# Patient Record
Sex: Female | Born: 1985 | Race: White | Hispanic: No | Smoking: Former smoker
Health system: Southern US, Community
[De-identification: ages and names within clinical notes are randomized; demographics above are authoritative.]

## PROBLEM LIST (undated history)

## (undated) DIAGNOSIS — Z8659 Personal history of other mental and behavioral disorders: Secondary | ICD-10-CM

## (undated) DIAGNOSIS — Z87898 Personal history of other specified conditions: Secondary | ICD-10-CM

## (undated) DIAGNOSIS — F32A Depression, unspecified: Secondary | ICD-10-CM

## (undated) DIAGNOSIS — K219 Gastro-esophageal reflux disease without esophagitis: Secondary | ICD-10-CM

## (undated) DIAGNOSIS — I10 Essential (primary) hypertension: Secondary | ICD-10-CM

## (undated) DIAGNOSIS — Z8719 Personal history of other diseases of the digestive system: Secondary | ICD-10-CM

## (undated) DIAGNOSIS — Z8669 Personal history of other diseases of the nervous system and sense organs: Secondary | ICD-10-CM

## (undated) DIAGNOSIS — F419 Anxiety disorder, unspecified: Secondary | ICD-10-CM

## (undated) DIAGNOSIS — D649 Anemia, unspecified: Secondary | ICD-10-CM

## (undated) DIAGNOSIS — Z860100 Personal history of colon polyps, unspecified: Secondary | ICD-10-CM

## (undated) DIAGNOSIS — F329 Major depressive disorder, single episode, unspecified: Secondary | ICD-10-CM

## (undated) HISTORY — DX: Personal history of colonic polyps: Z86.010

## (undated) HISTORY — DX: Personal history of other diseases of the nervous system and sense organs: Z86.69

## (undated) HISTORY — DX: Personal history of colon polyps, unspecified: Z86.0100

## (undated) HISTORY — DX: Personal history of other mental and behavioral disorders: Z86.59

## (undated) HISTORY — DX: Essential (primary) hypertension: I10

## (undated) HISTORY — PX: NO PAST SURGERIES: SHX2092

## (undated) HISTORY — DX: Personal history of other diseases of the digestive system: Z87.19

## (undated) HISTORY — DX: Personal history of other specified conditions: Z87.898

---

## 2003-10-15 ENCOUNTER — Inpatient Hospital Stay: Payer: Self-pay | Admitting: Psychiatry

## 2003-10-15 ENCOUNTER — Other Ambulatory Visit: Payer: Self-pay

## 2003-10-22 ENCOUNTER — Ambulatory Visit: Payer: Self-pay | Admitting: Unknown Physician Specialty

## 2003-10-28 ENCOUNTER — Ambulatory Visit: Payer: Self-pay | Admitting: Psychiatry

## 2003-11-03 ENCOUNTER — Ambulatory Visit: Payer: Self-pay | Admitting: Unknown Physician Specialty

## 2003-12-03 ENCOUNTER — Ambulatory Visit: Payer: Self-pay | Admitting: Unknown Physician Specialty

## 2004-01-03 ENCOUNTER — Ambulatory Visit: Payer: Self-pay | Admitting: Unknown Physician Specialty

## 2004-11-01 ENCOUNTER — Emergency Department (HOSPITAL_COMMUNITY): Admission: EM | Admit: 2004-11-01 | Discharge: 2004-11-01 | Payer: Self-pay | Admitting: Emergency Medicine

## 2005-05-06 ENCOUNTER — Emergency Department: Payer: Self-pay | Admitting: Internal Medicine

## 2005-05-07 ENCOUNTER — Emergency Department: Payer: Self-pay | Admitting: Emergency Medicine

## 2006-08-01 ENCOUNTER — Inpatient Hospital Stay: Payer: Self-pay

## 2008-10-03 ENCOUNTER — Emergency Department: Payer: Self-pay | Admitting: Emergency Medicine

## 2009-10-22 ENCOUNTER — Emergency Department: Payer: Self-pay | Admitting: Emergency Medicine

## 2010-12-31 ENCOUNTER — Inpatient Hospital Stay: Payer: Self-pay | Admitting: Obstetrics and Gynecology

## 2013-04-24 ENCOUNTER — Ambulatory Visit (INDEPENDENT_AMBULATORY_CARE_PROVIDER_SITE_OTHER): Payer: Self-pay | Admitting: Family Medicine

## 2013-04-24 ENCOUNTER — Encounter: Payer: Self-pay | Admitting: Family Medicine

## 2013-04-24 VITALS — BP 140/78 | HR 126 | Temp 98.6°F | Ht 61.25 in | Wt 128.2 lb

## 2013-04-24 DIAGNOSIS — R000 Tachycardia, unspecified: Secondary | ICD-10-CM

## 2013-04-24 DIAGNOSIS — F17200 Nicotine dependence, unspecified, uncomplicated: Secondary | ICD-10-CM

## 2013-04-24 DIAGNOSIS — Z720 Tobacco use: Secondary | ICD-10-CM

## 2013-04-24 DIAGNOSIS — R41840 Attention and concentration deficit: Secondary | ICD-10-CM

## 2013-04-24 DIAGNOSIS — F4321 Adjustment disorder with depressed mood: Secondary | ICD-10-CM

## 2013-04-24 DIAGNOSIS — Z634 Disappearance and death of family member: Secondary | ICD-10-CM

## 2013-04-24 DIAGNOSIS — F4329 Adjustment disorder with other symptoms: Secondary | ICD-10-CM | POA: Insufficient documentation

## 2013-04-24 DIAGNOSIS — Z136 Encounter for screening for cardiovascular disorders: Secondary | ICD-10-CM

## 2013-04-24 DIAGNOSIS — IMO0002 Reserved for concepts with insufficient information to code with codable children: Secondary | ICD-10-CM

## 2013-04-24 DIAGNOSIS — T7411XA Adult physical abuse, confirmed, initial encounter: Secondary | ICD-10-CM
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
## 2013-04-24 NOTE — Assessment & Plan Note (Signed)
Refer to Evalina FieldJane Perrin or Terri B for ADD eval. The patient indicates understanding of these issues and agrees with the plan.
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
## 2013-04-24 NOTE — Assessment & Plan Note (Signed)
Persisted throughout exam.  Pt reports being nervous but I cannot rule out an arrythmia or substance abuse. She is refusing EKG due to cost although I did encourage her to have this done today.  She wants to talk to her sister first.  Luckily, her husband works for [employer] so she did allow me to order some labs today. Orders Placed This Encounter  Procedures  . CBC with Differential  . Comprehensive metabolic panel  . Lipid panel  . TSH  . T4, Free  . Ambulatory referral to Psychology

## 2013-04-24 NOTE — Patient Instructions (Addendum)
It was nice to meet you. [facility] has a patient assistance program- please ask about this at the front desk.  Please go to front desk and let them know you need to set up a referral or they will call if you if this is not an urgent referral.  Either MARION or LINDA will help you set it up.   I will call you with your lab results.  I would like for you to have an EKG.  [facility] about [facility].

## 2013-04-24 NOTE — Progress Notes (Signed)
Pre visit review using our clinic review tool, if applicable. No additional management support is needed unless otherwise documented below in the visit note. 

## 2013-04-24 NOTE — Assessment & Plan Note (Signed)
See above.   Continue psychotherapy at hospice.

## 2013-04-25 ENCOUNTER — Encounter: Payer: Self-pay | Admitting: *Deleted

## 2013-04-25 ENCOUNTER — Ambulatory Visit (INDEPENDENT_AMBULATORY_CARE_PROVIDER_SITE_OTHER): Payer: Self-pay | Admitting: Psychology

## 2013-04-25 ENCOUNTER — Telehealth: Payer: Self-pay | Admitting: Family Medicine

## 2013-04-25 DIAGNOSIS — F988 Other specified behavioral and emotional disorders with onset usually occurring in childhood and adolescence: Secondary | ICD-10-CM

## 2013-04-25 LAB — CBC WITH DIFFERENTIAL/PLATELET

## 2013-04-25 LAB — COMPREHENSIVE METABOLIC PANEL
ALK PHOS: 59 IU/L (ref 39–117)
ALT: 8 IU/L (ref 0–32)
AST: 11 IU/L (ref 0–40)
Albumin/Globulin Ratio: 1.9 (ref 1.1–2.5)
Albumin: 4.5 g/dL (ref 3.5–5.5)
BUN/Creatinine Ratio: 20 (ref 8–20)
BUN: 9 mg/dL (ref 6–20)
CHLORIDE: 104 mmol/L (ref 97–108)
CO2: 21 mmol/L (ref 18–29)
Calcium: 9.3 mg/dL (ref 8.7–10.2)
Creatinine, Ser: 0.44 mg/dL — ABNORMAL LOW (ref 0.57–1.00)
GFR calc Af Amer: 158 mL/min/{1.73_m2} (ref >59)
GFR calc non Af Amer: 137 mL/min/{1.73_m2} (ref >59)
Globulin, Total: 2.4 g/dL (ref 1.5–4.5)
Glucose: 75 mg/dL (ref 65–99)
POTASSIUM: 4.2 mmol/L (ref 3.5–5.2)
SODIUM: 142 mmol/L (ref 134–144)
Total Bilirubin: <0.2 mg/dL (ref 0.0–1.2)
Total Protein: 6.9 g/dL (ref 6.0–8.5)

## 2013-04-25 LAB — LIPID PANEL
CHOLESTEROL TOTAL: 138 mg/dL (ref 100–199)
Chol/HDL Ratio: 3.1 ratio units (ref 0.0–4.4)
HDL: 44 mg/dL (ref >39)
LDL Calculated: 46 mg/dL (ref 0–99)
Triglycerides: 240 mg/dL — ABNORMAL HIGH (ref 0–149)
VLDL Cholesterol Cal: 48 mg/dL — ABNORMAL HIGH (ref 5–40)

## 2013-04-25 LAB — TSH: TSH: 1.66 u[IU]/mL (ref 0.450–4.500)

## 2013-04-25 LAB — T4, FREE: FREE T4: 1.48 ng/dL (ref 0.82–1.77)

## 2013-04-25 NOTE — Telephone Encounter (Signed)
Relevant patient education assigned to patient using Emmi. ° °

## 2013-04-28 ENCOUNTER — Other Ambulatory Visit: Payer: Self-pay | Admitting: Family Medicine

## 2013-04-28 DIAGNOSIS — R000 Tachycardia, unspecified: Secondary | ICD-10-CM

## 2013-04-29 ENCOUNTER — Telehealth: Payer: Self-pay

## 2013-04-29 NOTE — Telephone Encounter (Signed)
Spoke to pt and advised per Dr Aron. Pt verbally expressed understanding.  

## 2013-04-29 NOTE — Telephone Encounter (Signed)
I have not yet received the results from her tests but as soon as I do, we can talk about medications.

## 2013-04-29 NOTE — Telephone Encounter (Signed)
Pt left v/m; pt was seen on 04/24/13 and was sent for testing for ADD. Pt had testing done on 04/25/13. Pt wants to know if Dr Dayton MartesAron got the testing results for ADD; pt is wondering about med for depression since had ADD testing. Pt [facility] cb. [facility] [place].

## 2013-05-07 ENCOUNTER — Ambulatory Visit (INDEPENDENT_AMBULATORY_CARE_PROVIDER_SITE_OTHER): Payer: Medicaid Other | Admitting: Psychology

## 2013-05-07 DIAGNOSIS — F4323 Adjustment disorder with mixed anxiety and depressed mood: Secondary | ICD-10-CM

## 2013-05-07 DIAGNOSIS — F988 Other specified behavioral and emotional disorders with onset usually occurring in childhood and adolescence: Secondary | ICD-10-CM

## 2013-05-13 ENCOUNTER — Ambulatory Visit (INDEPENDENT_AMBULATORY_CARE_PROVIDER_SITE_OTHER): Payer: Medicaid Other | Admitting: Family Medicine

## 2013-05-13 ENCOUNTER — Encounter: Payer: Self-pay | Admitting: Family Medicine

## 2013-05-13 ENCOUNTER — Telehealth: Payer: Self-pay

## 2013-05-13 VITALS — BP 116/78 | HR 112 | Temp 98.3°F | Ht 61.25 in | Wt 126.8 lb

## 2013-05-13 DIAGNOSIS — F909 Attention-deficit hyperactivity disorder, unspecified type: Secondary | ICD-10-CM

## 2013-05-13 MED ORDER — AMPHETAMINE-DEXTROAMPHET ER 15 MG PO CP24: 15.0000 mg | ORAL_CAPSULE | ORAL | Status: DC

## 2013-05-13 NOTE — Telephone Encounter (Signed)
Yes ok to change to regular adderall.  Please call pharmacy to advise.

## 2013-05-13 NOTE — Telephone Encounter (Signed)
Pt left v/m; pt was seen earlier today and pt went to pharmacy but adderall XR cost was $200.00; pt does not have ins and could not afford $200.00. Pharmacy advised pt more cost effective to get Adderall plain without XR. Pt request cb.

## 2013-05-13 NOTE — Patient Instructions (Signed)
Great to see you. We are starting Adderall 15 mg XL daily. Please come see me or call me in 1 month.

## 2013-05-13 NOTE — Progress Notes (Signed)
Pre visit review using our clinic review tool, if applicable. No additional management support is needed unless otherwise documented below in the visit note. 
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
## 2013-05-13 NOTE — Assessment & Plan Note (Signed)
>  15 minutes spent in face to face time with patient, >50% spent in counselling or coordination of care. Discussed treatment options. Rx given for Adderall 15 mg XL daily. Follow up in 1 month. The patient indicates understanding of these issues and agrees with the plan.

## 2013-05-14 MED ORDER — AMPHETAMINE-DEXTROAMPHETAMINE 15 MG PO TABS: 15.0000 mg | ORAL_TABLET | Freq: Every day | ORAL | Status: DC

## 2013-05-14 NOTE — Telephone Encounter (Signed)
Spoke to pt and informed her Rx is available for pick up at the front desk 

## 2013-05-14 NOTE — Telephone Encounter (Signed)
Rx printed

## 2013-05-14 NOTE — Telephone Encounter (Signed)
Spoke to pharmacist who states that the pt will need a new Rx hard script as this is a "change in medication."

## 2013-05-22 ENCOUNTER — Emergency Department: Payer: Self-pay | Admitting: Emergency Medicine

## 2013-06-12 ENCOUNTER — Ambulatory Visit: Payer: Self-pay | Admitting: Family Medicine

## 2013-06-12 ENCOUNTER — Ambulatory Visit (INDEPENDENT_AMBULATORY_CARE_PROVIDER_SITE_OTHER): Payer: Medicaid Other | Admitting: Family Medicine

## 2013-06-12 ENCOUNTER — Encounter: Payer: Self-pay | Admitting: Family Medicine

## 2013-06-12 ENCOUNTER — Encounter: Payer: Self-pay | Admitting: *Deleted

## 2013-06-12 VITALS — BP 122/70 | HR 108 | Temp 97.9°F | Wt 125.5 lb

## 2013-06-12 DIAGNOSIS — Z634 Disappearance and death of family member: Secondary | ICD-10-CM

## 2013-06-12 DIAGNOSIS — J019 Acute sinusitis, unspecified: Secondary | ICD-10-CM

## 2013-06-12 DIAGNOSIS — IMO0002 Reserved for concepts with insufficient information to code with codable children: Secondary | ICD-10-CM

## 2013-06-12 DIAGNOSIS — F4329 Adjustment disorder with other symptoms: Secondary | ICD-10-CM

## 2013-06-12 DIAGNOSIS — F909 Attention-deficit hyperactivity disorder, unspecified type: Secondary | ICD-10-CM

## 2013-06-12 DIAGNOSIS — T7492XA Unspecified child maltreatment, confirmed, initial encounter: Secondary | ICD-10-CM

## 2013-06-12 DIAGNOSIS — F4381 Prolonged grief disorder: Secondary | ICD-10-CM

## 2013-06-12 DIAGNOSIS — T7411XA Adult physical abuse, confirmed, initial encounter: Secondary | ICD-10-CM

## 2013-06-12 DIAGNOSIS — Z0000 Encounter for general adult medical examination without abnormal findings: Secondary | ICD-10-CM | POA: Insufficient documentation

## 2013-06-12 DIAGNOSIS — Z309 Encounter for contraceptive management, unspecified: Secondary | ICD-10-CM

## 2013-06-12 DIAGNOSIS — F4321 Adjustment disorder with depressed mood: Secondary | ICD-10-CM

## 2013-06-12 MED ORDER — AMPHETAMINE-DEXTROAMPHETAMINE 15 MG PO TABS: 15.0000 mg | ORAL_TABLET | Freq: Every day | ORAL | Status: DC

## 2013-06-12 MED ORDER — MEDROXYPROGESTERONE ACETATE 150 MG/ML IM SUSP
150.0000 mg | Freq: Once | INTRAMUSCULAR | Status: AC
  Administered 2013-06-12: 150 mg via INTRAMUSCULAR

## 2013-06-12 NOTE — Assessment & Plan Note (Signed)
Discussed options. She would like to start depo today.

## 2013-06-12 NOTE — Patient Instructions (Signed)
Great to see you. Hang in there.  Please come back in 3 months for your depo injection.
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
## 2013-06-12 NOTE — Assessment & Plan Note (Signed)
Stable on current dose. Rx refilled.

## 2013-06-12 NOTE — Addendum Note (Signed)
Addended by: Desmond Dike on: 06/12/2013 12:48 PM   Modules accepted: Orders
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
## 2013-06-12 NOTE — Assessment & Plan Note (Signed)
S/p 10 day course of abx (awaiting records). Exam reassuring today.

## 2013-06-12 NOTE — Progress Notes (Signed)
Pre visit review using our clinic review tool, if applicable. No additional management support is needed unless otherwise documented below in the visit note. 

## 2013-06-23 ENCOUNTER — Telehealth: Payer: Self-pay

## 2013-06-23 NOTE — Telephone Encounter (Signed)
Pt left v/m; pt said adderall pt is presently taking makes pt feel like she is "crashing" after about 5 hours from taking med. Pt does not like the way medication makes her feel in the afternoon and pt would like to take a different med. Pt request cb.

## 2013-06-24 MED ORDER — AMPHETAMINE-DEXTROAMPHET ER 15 MG PO CP24: 15.0000 mg | ORAL_CAPSULE | ORAL | Status: DC

## 2013-06-24 NOTE — Telephone Encounter (Signed)
Yes that is because she is not on the extended release because she requested short acting due to cost.  We could try short acting twice daily if she would like.  If we switched rx, she would like have the same issues but we can try this as well if she is interested.

## 2013-06-24 NOTE — Telephone Encounter (Signed)
Lm on pts vm requesting a call back 

## 2013-06-24 NOTE — Telephone Encounter (Signed)
Rx printed

## 2013-06-24 NOTE — Telephone Encounter (Signed)
Spoke to pt who states that she has recently changed insurance companies and would like to try the extended release to see if it will be covered. If not, pt would like to precede with the short acting twice daily

## 2013-06-24 NOTE — Telephone Encounter (Signed)
Spoke to pt and informed her Rx is available for pickup from the front desk 

## 2013-07-07 ENCOUNTER — Telehealth: Payer: Self-pay

## 2013-07-07 ENCOUNTER — Encounter: Payer: Self-pay | Admitting: Family Medicine

## 2013-07-07 NOTE — Telephone Encounter (Signed)
Pt said since taking Adderall XR pt cannot go to sleep; usually 2AM before pt can go to sleep and pt has to get up at 6 AM.Pt request to go back on plain Adderall but to take twice a day as previously discussed. Call pt when rx ready for pick up.

## 2013-07-07 NOTE — Telephone Encounter (Signed)
Lm on pts vm advising per Dr Aron. Pt instructed to contact office should she have any additional questions 

## 2013-07-07 NOTE — Telephone Encounter (Signed)
Does she have any of her other rx (short acting) left because she is not yet due for another rx.

## 2013-07-07 NOTE — Telephone Encounter (Signed)
Ok to restart previous rx.

## 2013-07-07 NOTE — Telephone Encounter (Signed)
Spoke to pt who states that she does still have some of the short acting meds remaining.

## 2013-07-08 ENCOUNTER — Other Ambulatory Visit: Payer: Self-pay | Admitting: *Deleted

## 2013-07-08 ENCOUNTER — Telehealth: Payer: Self-pay | Admitting: Family Medicine

## 2013-07-08 MED ORDER — AMPHETAMINE-DEXTROAMPHETAMINE 15 MG PO TABS: 15.0000 mg | ORAL_TABLET | Freq: Two times a day (BID) | ORAL | Status: DC

## 2013-07-08 NOTE — Telephone Encounter (Signed)
Spoke to pt who is requesting medication refill. She recently switched back to short acting adderall BID and states that she does not have to last until 06/23 when the XR Rx was written. Last f/u appt 06/2013. pls advise

## 2013-07-08 NOTE — Telephone Encounter (Signed)
Ok to print out rx and put in my box.

## 2013-07-08 NOTE — Telephone Encounter (Signed)
Spoke to pt who states that she is needing medication refill. Request sent to Dr Dayton MartesAron for approval

## 2013-07-08 NOTE — Telephone Encounter (Signed)
Pt has some questions in reference to your conversation yesterday about medication. She would like for you to call her back. Thank you.

## 2013-07-09 NOTE — Telephone Encounter (Signed)
Lyla SonCarrie spoke to pt and informed her Rx is available for pickup at the front desk

## 2013-07-17 ENCOUNTER — Ambulatory Visit (INDEPENDENT_AMBULATORY_CARE_PROVIDER_SITE_OTHER)
Admission: RE | Admit: 2013-07-17 | Discharge: 2013-07-17 | Disposition: A | Discharge status: Home / Self Care | Payer: Medicaid Other | Source: Ambulatory Visit | Attending: Family Medicine | Admitting: Family Medicine

## 2013-07-17 ENCOUNTER — Encounter: Payer: Self-pay | Admitting: Family Medicine

## 2013-07-17 ENCOUNTER — Ambulatory Visit (INDEPENDENT_AMBULATORY_CARE_PROVIDER_SITE_OTHER): Payer: Medicaid Other | Admitting: Family Medicine

## 2013-07-17 VITALS — BP 116/68 | HR 105 | Temp 97.9°F | Wt 125.5 lb

## 2013-07-17 DIAGNOSIS — R053 Chronic cough: Secondary | ICD-10-CM | POA: Insufficient documentation

## 2013-07-17 DIAGNOSIS — R05 Cough: Secondary | ICD-10-CM

## 2013-07-17 DIAGNOSIS — R059 Cough, unspecified: Secondary | ICD-10-CM

## 2013-07-17 DIAGNOSIS — F17200 Nicotine dependence, unspecified, uncomplicated: Secondary | ICD-10-CM

## 2013-07-17 DIAGNOSIS — Z720 Tobacco use: Secondary | ICD-10-CM

## 2013-07-17 MED ORDER — HYDROCOD POLST-CHLORPHEN POLST 10-8 MG/5ML PO LQCR: 5.0000 mL | Freq: Every evening | ORAL | Status: DC | PRN

## 2013-07-17 MED ORDER — AZITHROMYCIN 250 MG PO TABS: ORAL_TABLET | ORAL | Status: DC

## 2013-07-17 MED ORDER — ALBUTEROL SULFATE HFA 108 (90 BASE) MCG/ACT IN AERS: 2.0000 | INHALATION_SPRAY | Freq: Four times a day (QID) | RESPIRATORY_TRACT | Status: DC | PRN

## 2013-07-17 MED ORDER — VARENICLINE TARTRATE 0.5 MG X 11 & 1 MG X 42 PO MISC: ORAL | Status: DC

## 2013-07-17 NOTE — Assessment & Plan Note (Addendum)
Ready to quit. Discussed tx options. She does want to try Chantix. Rx given to pt.

## 2013-07-17 NOTE — Addendum Note (Signed)
Addended by: Dianne DunARON, Raisa Ditto M on: 07/17/2013 11:40 AM   Modules accepted: Orders

## 2013-07-17 NOTE — Progress Notes (Addendum)
SUBJECTIVE:  Catherine [name] is a 28 y.o. female who complains of congestion and sore throat for on and off for months. She denies a history of anorexia and chest pain and denies a history of asthma. Patient admits to smoke cigarettes.  She is smoking 1.5 ppd.  She does want to quit for her kids.  Patches and gums have never worked for her in the past. Current Outpatient Prescriptions on File Prior to Visit  Medication Sig Dispense Refill  . amphetamine-dextroamphetamine (ADDERALL) 15 MG tablet Take 1 tablet (15 mg total) by mouth 2 (two) times daily.  60 tablet  0   No current facility-administered medications on file prior to visit.    No Known Allergies  Past Medical History  Diagnosis Date  . History of depression   . History of headache   . History of gastroesophageal reflux (GERD)   . History of migraine   . History of colon polyps     No past surgical history on file.  Family History  Problem Relation Age of Onset  . Lung cancer Mother   . Lung cancer Paternal Grandfather   . Lung cancer Paternal Grandmother   . Heart attack Father   . Heart disease Maternal Grandmother   . Depression Maternal Grandfather   . Diabetes Maternal Grandfather     History   Social History  . Marital Status: Married    Spouse Name: N/A    Number of Children: N/A  . Years of Education: N/A   Occupational History  . Not on file.   Social History Main Topics  . Smoking status: Current Every Day Smoker -- 0.50 packs/day    Types: Cigarettes  . Smokeless tobacco: Never Used  . Alcohol Use: Yes     Comment: occ  . Drug Use: No  . Sexual Activity: Not on file   Other Topics Concern  . Not on file   Social History Narrative  . No narrative on file   The PMH, PSH, Social History, Family History, Medications, and allergies have [facility], and have been updated if relevant.    OBJECTIVE: BP 116/68  Pulse 105  Temp(Src) 97.9 F (36.6 C) (Oral)  Wt 125 lb 8 oz  (56.926 kg)  SpO2 98%  She appears well, vital signs are as noted. Ears normal.  Throat and pharynx normal.  Neck supple. No adenopathy in the neck. Nose is congested. Sinuses non tender. The chest is clear, without wheezes or rales.

## 2013-07-17 NOTE — Progress Notes (Signed)
Pre visit review using our clinic review tool, if applicable. No additional management support is needed unless otherwise documented below in the visit note. 

## 2013-07-17 NOTE — Addendum Note (Signed)
Addended by: Dianne DunARON, Dejanique Ruehl M on: 07/17/2013 04:05 PM   Modules accepted: Orders

## 2013-07-17 NOTE — Assessment & Plan Note (Addendum)
Cough- likely irritation/bronchospasm from smoking. Lung exam benign today.  Given duration of symptoms along with smoking history, will get CXR today. Symptomatic therapy suggested: push fluids, rest and return office visit prn if symptoms persist or worsen. Lack of antibiotic effectiveness discussed with her. Tussionex rx given to pt.  Call or return to clinic prn if these symptoms worsen or fail to improve as anticipated.

## 2013-07-28 ENCOUNTER — Other Ambulatory Visit: Payer: Self-pay

## 2013-07-28 MED ORDER — AMPHETAMINE-DEXTROAMPHETAMINE 15 MG PO TABS: 15.0000 mg | ORAL_TABLET | Freq: Two times a day (BID) | ORAL | Status: DC

## 2013-07-28 NOTE — Telephone Encounter (Signed)
Ok to print out and put on my desk for signature. 

## 2013-07-28 NOTE — Telephone Encounter (Signed)
Pt left v/m requesting rx for Adderall. Call when ready for pick up.  

## 2013-07-29 ENCOUNTER — Telehealth: Payer: Self-pay

## 2013-07-29 ENCOUNTER — Encounter (HOSPITAL_COMMUNITY): Payer: Self-pay | Admitting: Emergency Medicine

## 2013-07-29 ENCOUNTER — Emergency Department: Payer: Self-pay | Admitting: Emergency Medicine

## 2013-07-29 ENCOUNTER — Emergency Department (HOSPITAL_COMMUNITY)
Admission: EM | Admit: 2013-07-29 | Discharge: 2013-07-29 | Disposition: A | Discharge status: Home / Self Care | Payer: 59 | Attending: Emergency Medicine | Admitting: Emergency Medicine

## 2013-07-29 DIAGNOSIS — Z8719 Personal history of other diseases of the digestive system: Secondary | ICD-10-CM | POA: Insufficient documentation

## 2013-07-29 DIAGNOSIS — F10920 Alcohol use, unspecified with intoxication, uncomplicated: Secondary | ICD-10-CM

## 2013-07-29 DIAGNOSIS — Z792 Long term (current) use of antibiotics: Secondary | ICD-10-CM | POA: Diagnosis not present

## 2013-07-29 DIAGNOSIS — Z860100 Personal history of colon polyps, unspecified: Secondary | ICD-10-CM | POA: Insufficient documentation

## 2013-07-29 DIAGNOSIS — IMO0002 Reserved for concepts with insufficient information to code with codable children: Secondary | ICD-10-CM | POA: Diagnosis not present

## 2013-07-29 DIAGNOSIS — R413 Other amnesia: Secondary | ICD-10-CM | POA: Diagnosis not present

## 2013-07-29 DIAGNOSIS — Z8679 Personal history of other diseases of the circulatory system: Secondary | ICD-10-CM | POA: Diagnosis not present

## 2013-07-29 DIAGNOSIS — F29 Unspecified psychosis not due to a substance or known physiological condition: Secondary | ICD-10-CM | POA: Diagnosis not present

## 2013-07-29 DIAGNOSIS — F1010 Alcohol abuse, uncomplicated: Secondary | ICD-10-CM | POA: Diagnosis not present

## 2013-07-29 DIAGNOSIS — F17200 Nicotine dependence, unspecified, uncomplicated: Secondary | ICD-10-CM | POA: Diagnosis not present

## 2013-07-29 DIAGNOSIS — T50995A Adverse effect of other drugs, medicaments and biological substances, initial encounter: Secondary | ICD-10-CM | POA: Diagnosis not present

## 2013-07-29 DIAGNOSIS — T50905A Adverse effect of unspecified drugs, medicaments and biological substances, initial encounter: Secondary | ICD-10-CM

## 2013-07-29 DIAGNOSIS — Z79899 Other long term (current) drug therapy: Secondary | ICD-10-CM | POA: Insufficient documentation

## 2013-07-29 LAB — COMPREHENSIVE METABOLIC PANEL
ALT: 15 U/L (ref 0–35)
AST: 17 U/L (ref 0–37)
Albumin: 4.1 g/dL (ref 3.5–5.2)
Alkaline Phosphatase: 72 U/L (ref 39–117)
Anion gap: 15 (ref 5–15)
BUN: 9 mg/dL (ref 6–23)
CO2: 21 mEq/L (ref 19–32)
Calcium: 8.7 mg/dL (ref 8.4–10.5)
Chloride: 108 mEq/L (ref 96–112)
Creatinine, Ser: 0.56 mg/dL (ref 0.50–1.10)
GFR calc Af Amer: >90 mL/min (ref >90)
GFR calc non Af Amer: >90 mL/min (ref >90)
Glucose, Bld: 111 mg/dL — ABNORMAL HIGH (ref 70–99)
Potassium: 4 mEq/L (ref 3.7–5.3)
Sodium: 144 mEq/L (ref 137–147)
Total Bilirubin: <0.2 mg/dL — ABNORMAL LOW (ref 0.3–1.2)
Total Protein: 7.6 g/dL (ref 6.0–8.3)

## 2013-07-29 LAB — RAPID URINE DRUG SCREEN, HOSP PERFORMED
Amphetamines: NOT DETECTED
Barbiturates: NOT DETECTED
Benzodiazepines: NOT DETECTED
Cocaine: NOT DETECTED
OPIATES: NOT DETECTED
TETRAHYDROCANNABINOL: NOT DETECTED

## 2013-07-29 LAB — CBC
HCT: 41.8 % (ref 36.0–46.0)
Hemoglobin: 14.4 g/dL (ref 12.0–15.0)
MCH: 32.1 pg (ref 26.0–34.0)
MCHC: 34.4 g/dL (ref 30.0–36.0)
MCV: 93.3 fL (ref 78.0–100.0)
PLATELETS: 360 10*3/uL (ref 150–400)
RBC: 4.48 MIL/uL (ref 3.87–5.11)
RDW: 12.4 % (ref 11.5–15.5)
WBC: 12.4 10*3/uL — ABNORMAL HIGH (ref 4.0–10.5)

## 2013-07-29 LAB — ACETAMINOPHEN LEVEL: Acetaminophen (Tylenol), Serum: <15 ug/mL (ref 10–30)

## 2013-07-29 LAB — SALICYLATE LEVEL: SALICYLATE LVL: 11.3 mg/dL (ref 2.8–20.0)

## 2013-07-29 LAB — ETHANOL: Alcohol, Ethyl (B): 251 mg/dL — ABNORMAL HIGH (ref 0–11)

## 2013-07-29 MED ORDER — LORAZEPAM 1 MG PO TABS
1.0000 mg | ORAL_TABLET | Freq: Once | ORAL | Status: AC
  Administered 2013-07-29: 1 mg via ORAL
  Filled 2013-07-29: qty 1

## 2013-07-29 MED ORDER — ACETAMINOPHEN 325 MG PO TABS
650.0000 mg | ORAL_TABLET | Freq: Once | ORAL | Status: AC
  Administered 2013-07-29: 650 mg via ORAL
  Filled 2013-07-29: qty 2
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
## 2013-07-29 NOTE — Discharge Instructions (Signed)
Stop taking your Chantix.  Call your Primary Care Physician in the morning to schedule an appointment

## 2013-07-29 NOTE — Telephone Encounter (Signed)
Reviewed pts last Rx and per Dr Dayton MartesAron, it is too early for pt to receive Rx. Last Rx filled 07/08/13. Spoke to pt and advised;states that she will contact us closer to due date to have filled
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
## 2013-07-30 ENCOUNTER — Ambulatory Visit (INDEPENDENT_AMBULATORY_CARE_PROVIDER_SITE_OTHER): Payer: Medicaid Other | Admitting: Internal Medicine

## 2013-07-30 ENCOUNTER — Telehealth: Payer: Self-pay | Admitting: Internal Medicine

## 2013-07-30 ENCOUNTER — Telehealth: Payer: Self-pay | Admitting: *Deleted

## 2013-07-30 ENCOUNTER — Encounter: Payer: Self-pay | Admitting: Internal Medicine

## 2013-07-30 VITALS — BP 110/72 | HR 108 | Temp 98.1°F | Wt 127.0 lb

## 2013-07-30 DIAGNOSIS — R110 Nausea: Secondary | ICD-10-CM

## 2013-07-30 DIAGNOSIS — G44209 Tension-type headache, unspecified, not intractable: Secondary | ICD-10-CM

## 2013-07-30 DIAGNOSIS — F419 Anxiety disorder, unspecified: Principal | ICD-10-CM

## 2013-07-30 DIAGNOSIS — R42 Dizziness and giddiness: Secondary | ICD-10-CM

## 2013-07-30 DIAGNOSIS — F05 Delirium due to known physiological condition: Secondary | ICD-10-CM

## 2013-07-30 DIAGNOSIS — R780 Finding of alcohol in blood: Secondary | ICD-10-CM

## 2013-07-30 DIAGNOSIS — Y908 Blood alcohol level of 240 mg/100 ml or more: Secondary | ICD-10-CM

## 2013-07-30 DIAGNOSIS — F329 Major depressive disorder, single episode, unspecified: Secondary | ICD-10-CM

## 2013-07-30 DIAGNOSIS — F341 Dysthymic disorder: Secondary | ICD-10-CM

## 2013-07-30 DIAGNOSIS — R197 Diarrhea, unspecified: Secondary | ICD-10-CM

## 2013-07-30 MED ORDER — BUPROPION HCL ER (XL) 150 MG PO TB24: 150.0000 mg | ORAL_TABLET | Freq: Every day | ORAL | Status: DC

## 2013-07-30 MED ORDER — METHYLPREDNISOLONE ACETATE 80 MG/ML IJ SUSP
80.0000 mg | Freq: Once | INTRAMUSCULAR | Status: AC
  Administered 2013-07-30: 80 mg via INTRAMUSCULAR

## 2013-07-30 MED ORDER — SERTRALINE HCL 50 MG PO TABS: 50.0000 mg | ORAL_TABLET | Freq: Every day | ORAL | Status: DC
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
## 2013-07-30 NOTE — Telephone Encounter (Signed)
Kelly from [facility] calling:  Pt given a rx today that has a drug interaction with Adderall that she is already taking.  They need a call back to get a different med prescribed if possible. [phone]

## 2013-07-30 NOTE — Progress Notes (Signed)
Pre visit review using our clinic review tool, if applicable. No additional management support is needed unless otherwise documented below in the visit note. 

## 2013-07-30 NOTE — Telephone Encounter (Signed)
Called pharmacy and changed as instructed with same refills as Zoloft--also changed in chart--

## 2013-07-30 NOTE — Telephone Encounter (Signed)
I'm glad she is following up with Rene Kocheregina.  Keep us updated.

## 2013-07-30 NOTE — Telephone Encounter (Signed)
Test results are not back ok to call in 30 tramadol 50 mg tab take 1 every 8 hours as needed, 0 refills. If HA worsens, back to ER

## 2013-07-30 NOTE — Addendum Note (Signed)
Addended by: Roena MaladyEVONTENNO, Marrie Chandra Y on: 07/30/2013 01:29 PM   Modules accepted: Orders

## 2013-07-30 NOTE — Telephone Encounter (Signed)
Change to wellbutrin er 150 mg daily

## 2013-07-30 NOTE — Telephone Encounter (Signed)
Spoke to pt who states that she "still is not feeling right." Pt was to have f/u today and has sched appt with RBaity at 1045

## 2013-07-30 NOTE — Addendum Note (Signed)
Addended by: Roena MaladyEVONTENNO, Nicolasa Milbrath Y on: 07/30/2013 03:10 PM   Modules accepted: Orders

## 2013-07-30 NOTE — Telephone Encounter (Signed)
Patient seen today and was given steroid injection and was told to go home and rest. She is calling stating she can't rest and now has a horrible HA and wants a Rx for that. She is also requesting test results. [phone]

## 2013-07-30 NOTE — Telephone Encounter (Signed)
Was seen in [place].  Please call to check on pt.

## 2013-07-30 NOTE — Patient Instructions (Addendum)

## 2013-07-31 LAB — ETHANOL: Alcohol, Ethyl (B): <10 mg/dL (ref 0–10)

## 2013-07-31 NOTE — Telephone Encounter (Signed)
Per Revonda StandardAllison [name] pt called and is aware

## 2013-07-31 NOTE — Telephone Encounter (Signed)
BAC normal

## 2013-07-31 NOTE — ED Provider Notes (Signed)
Medical screening examination/treatment/procedure(s) were performed by non-physician practitioner and as supervising physician I was immediately available for consultation/collaboration.   EKG Interpretation   Date/Time:  Tuesday July 29 2013 17:33:13 EDT Ventricular Rate:  102 PR Interval:  120 QRS Duration: 74 QT Interval:  342 QTC Calculation: 445 R Axis:   82 Text Interpretation:  Sinus tachycardia Otherwise normal ECG ED PHYSICIAN  INTERPRETATION AVAILABLE IN [facility] Confirmed by TEST, Record  ([id number]) on 07/31/2013 7:46:37 AM        Shanna CiscoMegan E Epifania Littrell, MD 07/31/13 1932

## 2013-07-31 NOTE — Telephone Encounter (Signed)
The result is in chart--can you interpret so i can let pt know when calling her? Will call in Rx in the meantime

## 2013-08-01 ENCOUNTER — Telehealth: Payer: Self-pay

## 2013-08-01 NOTE — Telephone Encounter (Signed)
Pt left v/m to ck status of drug screen test results and pt wanted to make sure she would have no problems picking up the adderall rx on 08/04/13. Pt request cb.

## 2013-08-04 ENCOUNTER — Encounter: Payer: Self-pay | Admitting: Family Medicine

## 2013-08-04 ENCOUNTER — Telehealth: Payer: Self-pay | Admitting: Family Medicine

## 2013-08-04 ENCOUNTER — Ambulatory Visit (INDEPENDENT_AMBULATORY_CARE_PROVIDER_SITE_OTHER): Payer: Medicaid Other | Admitting: Family Medicine

## 2013-08-04 VITALS — BP 102/60 | HR 101 | Temp 98.3°F | Wt 126.8 lb

## 2013-08-04 DIAGNOSIS — F17200 Nicotine dependence, unspecified, uncomplicated: Secondary | ICD-10-CM

## 2013-08-04 DIAGNOSIS — R4182 Altered mental status, unspecified: Secondary | ICD-10-CM | POA: Insufficient documentation

## 2013-08-04 DIAGNOSIS — R404 Transient alteration of awareness: Secondary | ICD-10-CM

## 2013-08-04 DIAGNOSIS — Z720 Tobacco use: Secondary | ICD-10-CM

## 2013-08-04 DIAGNOSIS — Z5189 Encounter for other specified aftercare: Secondary | ICD-10-CM

## 2013-08-04 DIAGNOSIS — T7491XA Unspecified adult maltreatment, confirmed, initial encounter: Secondary | ICD-10-CM

## 2013-08-04 DIAGNOSIS — IMO0001 Reserved for inherently not codable concepts without codable children: Secondary | ICD-10-CM

## 2013-08-04 DIAGNOSIS — F900 Attention-deficit hyperactivity disorder, predominantly inattentive type: Secondary | ICD-10-CM

## 2013-08-04 DIAGNOSIS — T7411XD Adult physical abuse, confirmed, subsequent encounter: Secondary | ICD-10-CM

## 2013-08-04 DIAGNOSIS — F909 Attention-deficit hyperactivity disorder, unspecified type: Secondary | ICD-10-CM

## 2013-08-04 MED ORDER — AMPHETAMINE-DEXTROAMPHETAMINE 20 MG PO TABS: 20.0000 mg | ORAL_TABLET | Freq: Two times a day (BID) | ORAL | Status: DC

## 2013-08-04 MED ORDER — AMPHETAMINE-DEXTROAMPHETAMINE 15 MG PO TABS: 15.0000 mg | ORAL_TABLET | Freq: Two times a day (BID) | ORAL | Status: DC

## 2013-08-04 NOTE — Telephone Encounter (Signed)
Results are not back yet. They had to be sent out to another lab. I did not refill her adderall.

## 2013-08-04 NOTE — Telephone Encounter (Signed)
I do not see results.  Will route to ChicoraRegina who saw her for this.

## 2013-08-04 NOTE — Assessment & Plan Note (Signed)
See above. Resolved. No antidepressant started.  She would like to try increased dose of Adderall first.

## 2013-08-04 NOTE — Assessment & Plan Note (Signed)
Deteriorated. Will increase dose of Adderall to 20 mg twice daily.
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
## 2013-08-04 NOTE — Progress Notes (Signed)
Pre visit review using our clinic review tool, if applicable. No additional management support is needed unless otherwise documented below in the visit note. 

## 2013-08-04 NOTE — Telephone Encounter (Signed)
Relevant patient education assigned to patient using Emmi. ° °

## 2013-08-04 NOTE — Assessment & Plan Note (Signed)
chantix added to allergy list although I question if truly an allergy- more likely that chantix adversely effected how ETOH was metabolized in her system and caused aggressive behavior.   Discussed patched and gums.

## 2013-08-15 ENCOUNTER — Encounter: Payer: Self-pay | Admitting: Family Medicine

## 2013-08-15 ENCOUNTER — Ambulatory Visit (INDEPENDENT_AMBULATORY_CARE_PROVIDER_SITE_OTHER): Payer: Medicaid Other | Admitting: Family Medicine

## 2013-08-15 VITALS — BP 114/66 | HR 114 | Temp 98.3°F | Wt 123.2 lb

## 2013-08-15 DIAGNOSIS — R739 Hyperglycemia, unspecified: Secondary | ICD-10-CM | POA: Insufficient documentation

## 2013-08-15 DIAGNOSIS — R233 Spontaneous ecchymoses: Secondary | ICD-10-CM | POA: Insufficient documentation

## 2013-08-15 DIAGNOSIS — R7309 Other abnormal glucose: Secondary | ICD-10-CM

## 2013-08-15 DIAGNOSIS — R238 Other skin changes: Secondary | ICD-10-CM | POA: Insufficient documentation

## 2013-08-15 LAB — COMPREHENSIVE METABOLIC PANEL
ALT: 17 U/L (ref 0–35)
AST: 20 U/L (ref 0–37)
Albumin: 4.4 g/dL (ref 3.5–5.2)
Alkaline Phosphatase: 62 U/L (ref 39–117)
BUN: 20 mg/dL (ref 6–23)
CALCIUM: 10.4 mg/dL (ref 8.4–10.5)
CHLORIDE: 102 meq/L (ref 96–112)
CO2: 23 mEq/L (ref 19–32)
CREATININE: 0.8 mg/dL (ref 0.4–1.2)
GFR: 92.02 mL/min (ref >60.00)
Glucose, Bld: 56 mg/dL — ABNORMAL LOW (ref 70–99)
POTASSIUM: 4.1 meq/L (ref 3.5–5.1)
Sodium: 135 mEq/L (ref 135–145)
Total Bilirubin: 0.2 mg/dL (ref 0.2–1.2)
Total Protein: 7.6 g/dL (ref 6.0–8.3)

## 2013-08-15 LAB — PROTIME-INR
INR: 1 ratio (ref 0.8–1.0)
Prothrombin Time: 10.9 s (ref 9.6–13.1)

## 2013-08-15 LAB — HEMOGLOBIN A1C: Hgb A1c MFr Bld: 5.7 % (ref 4.6–6.5)

## 2013-08-15 NOTE — Assessment & Plan Note (Signed)
New- recent lab work reassuring but she feels this is progressing. Will recheck labs. Orders Placed This Encounter  Procedures  . Hemoglobin A1c  . CBC with Differential  . Comprehensive metabolic panel  . Protime-INR  . Glucose (CBG), Fasting

## 2013-08-15 NOTE — Patient Instructions (Signed)
Good to see you. I will call you with your lab results.   

## 2013-08-15 NOTE — Assessment & Plan Note (Signed)
New- Fasting FSBS in office 104 which is reassuring. Will check a1c today. The patient indicates understanding of these issues and agrees with the plan.

## 2013-08-15 NOTE — Progress Notes (Signed)
Subjective:   Patient ID: Catherine NordmannMiranda Barradas, female    DOB: 08/30/1985, 28 y.o.   MRN: [id number]  Catherine [name] is a pleasant 28 y.o. year old female who presents to clinic today with Blood Sugar Problem  on 08/15/2013  HPI: Concerned her blood sugar has been elevated.  Has a friend who has diabetes. She has increased thirst, HA, feeling bloated at the end of the day.  Ranging 118-130 fasting.  +FH of DM- grandfather.  Easy bruising- past few weeks has noticed very large bruising but cannot remember an injury.  Happening almost daily.  No nose bleeds or blood in urine. +fatigue Recent lab work unremarkable Lab Results  Component Value Date   WBC 12.4* 07/29/2013   HGB 14.4 07/29/2013   HCT 41.8 07/29/2013   MCV 93.3 07/29/2013   PLT 360 07/29/2013   Lab Results  Component Value Date   ALT 15 07/29/2013   AST 17 07/29/2013   ALKPHOS 72 07/29/2013   BILITOT <0.2* 07/29/2013     Current Outpatient Prescriptions on File Prior to Visit  Medication Sig Dispense Refill  . albuterol (PROVENTIL HFA;VENTOLIN HFA) 108 (90 BASE) MCG/ACT inhaler Inhale 2 puffs into the lungs every 6 (six) hours as needed.  1 Inhaler  0  . amphetamine-dextroamphetamine (ADDERALL) 20 MG tablet Take 1 tablet (20 mg total) by mouth 2 (two) times daily.  60 tablet  0  . buPROPion (WELLBUTRIN XL) 150 MG 24 hr tablet Take 1 tablet (150 mg total) by mouth daily.  30 tablet  3   No current facility-administered medications on file prior to visit.    Allergies  Allergen Reactions  . Chantix [Varenicline]     Altered mental status     Past Medical History  Diagnosis Date  . History of depression   . History of headache   . History of gastroesophageal reflux (GERD)   . History of migraine   . History of colon polyps     No past surgical history on file.  Family History  Problem Relation Age of Onset  . Lung cancer Mother   . Lung cancer Paternal Grandfather   . Lung cancer Paternal Grandmother   .  Heart attack Father   . Heart disease Maternal Grandmother   . Depression Maternal Grandfather   . Diabetes Maternal Grandfather     History   Social History  . Marital Status: Married    Spouse Name: N/A    Number of Children: N/A  . Years of Education: N/A   Occupational History  . Not on file.   Social History Main Topics  . Smoking status: Current Every Day Smoker -- 0.50 packs/day    Types: Cigarettes  . Smokeless tobacco: Never Used  . Alcohol Use: Yes     Comment: occasional  . Drug Use: No  . Sexual Activity: Not on file   Other Topics Concern  . Not on file   Social History Narrative  . No narrative on file   The PMH, PSH, Social History, Family History, Medications, and allergies [facility], and have been updated if relevant.    Review of Systems See HPI No blurred vision Increased urination No nausea or vomiting Does drink a lot of regular sodas     Objective:    BP 114/66  Pulse 114  Temp(Src) 98.3 F (36.8 C) (Oral)  Wt 123 lb 4 oz (55.906 kg)  SpO2 98%   Physical Exam  Nursing note  and vitals reviewed. Constitutional: She is oriented to person, place, and time. She appears well-developed and well-nourished.  HENT:  Head: Normocephalic.  Eyes: Pupils are equal, round, and reactive to light.  Neck: Normal range of motion. Neck supple.  Cardiovascular: Normal rate and regular rhythm.   Abdominal: Soft. Bowel sounds are normal.  Neurological: She is alert and oriented to person, place, and time.  Skin: Skin is warm and dry.  + large echymosis on left lower leg, several smaller ones on right leg in various stages of healing        Assessment & Plan:   Hyperglycemia - Plan: Glucose (CBG), Fasting No Follow-up on file.

## 2013-08-15 NOTE — Progress Notes (Signed)
Pre visit review using our clinic review tool, if applicable. No additional management support is needed unless otherwise documented below in the visit note. 

## 2013-08-18 ENCOUNTER — Encounter: Payer: Self-pay | Admitting: Family Medicine

## 2013-08-21 ENCOUNTER — Encounter: Payer: Self-pay | Admitting: Family Medicine

## 2013-08-26 ENCOUNTER — Other Ambulatory Visit: Payer: Self-pay

## 2013-08-26 MED ORDER — AMPHETAMINE-DEXTROAMPHETAMINE 20 MG PO TABS: 20.0000 mg | ORAL_TABLET | Freq: Two times a day (BID) | ORAL | Status: DC

## 2013-08-26 NOTE — Telephone Encounter (Signed)
Pt left v/m requesting rx adderall. Call when ready for pick up. Last Adderall rx done on 08/04/13. Unable to reach pt by phone to see why needs early pick up.

## 2013-08-27 NOTE — Telephone Encounter (Signed)
Spoke to pt and informed her that Rx request is too early. Pt advised to contact office  8/2-03/2013

## 2013-08-28 ENCOUNTER — Ambulatory Visit: Payer: Medicaid Other

## 2013-09-01 ENCOUNTER — Other Ambulatory Visit: Payer: Self-pay | Admitting: Family Medicine

## 2013-09-01 ENCOUNTER — Encounter: Payer: Self-pay | Admitting: Family Medicine

## 2013-09-01 MED ORDER — AMPHETAMINE-DEXTROAMPHETAMINE 20 MG PO TABS: 20.0000 mg | ORAL_TABLET | Freq: Two times a day (BID) | ORAL | Status: DC

## 2013-09-01 NOTE — Telephone Encounter (Signed)
Px printed for pick up in IN box  

## 2013-09-01 NOTE — Telephone Encounter (Signed)
Pt contacted office and advised Rx is available for pickup from the front desk

## 2013-09-01 NOTE — Telephone Encounter (Signed)
No. This was actually one that Rena sent to Dr Dayton Martes that was too early to fill, as it was not due until 09/04/13. Pt did not receive Rx on 8/25; my apologies for not including that information

## 2013-09-01 NOTE — Telephone Encounter (Signed)
Pt requesting medication refill. Last f/u appt 08/2013 with no f/u appts scheduled. Pt will be out of tabs on today. Pls advise

## 2013-09-01 NOTE — Telephone Encounter (Signed)
?   Chart says this was refilled 6 days ago/ am I wrong?

## 2013-09-19 ENCOUNTER — Ambulatory Visit (INDEPENDENT_AMBULATORY_CARE_PROVIDER_SITE_OTHER): Payer: Medicaid Other | Admitting: Internal Medicine

## 2013-09-19 ENCOUNTER — Encounter: Payer: Self-pay | Admitting: Internal Medicine

## 2013-09-19 VITALS — BP 114/70 | HR 109 | Temp 98.0°F | Wt 121.0 lb

## 2013-09-19 DIAGNOSIS — F419 Anxiety disorder, unspecified: Principal | ICD-10-CM

## 2013-09-19 DIAGNOSIS — F341 Dysthymic disorder: Secondary | ICD-10-CM

## 2013-09-19 DIAGNOSIS — F329 Major depressive disorder, single episode, unspecified: Secondary | ICD-10-CM | POA: Insufficient documentation

## 2013-09-19 NOTE — Patient Instructions (Addendum)

## 2013-09-19 NOTE — Progress Notes (Signed)
Subjective:    Patient ID: Catherine [name], female    DOB: 03/25/1985, 28 y.o.   MRN: [id number]  HPI  Pt presents to the clinic today with c/o fatigue and depression. This just started over the last week. She reports like she "just don't feel myself". She was seen for the same 7/29. At that point she was supposed to start on zoloft due to worsening anxiety and depression due to stressful life situations(not employed, getting divorce, recent eviction notice). When she went to the pharmancy, the medication she received was wellbutrin. She was advised not to take this due to a drug interaction with Adderall. She did follow up with Dr. Dayton Martes who tried increasing her Adderall. It did seem to help in the beginning but does not seem to be working now. She just reports feeling very stressed and overwhelmed. She is not sure what she should do about this.  Review of Systems      Past Medical History  Diagnosis Date  . History of depression   . History of headache   . History of gastroesophageal reflux (GERD)   . History of migraine   . History of colon polyps     Current Outpatient Prescriptions  Medication Sig Dispense Refill  . amphetamine-dextroamphetamine (ADDERALL) 20 MG tablet Take 1 tablet (20 mg total) by mouth 2 (two) times daily.  60 tablet  0   No current facility-administered medications for this visit.    Allergies  Allergen Reactions  . Chantix [Varenicline]     Altered mental status     Family History  Problem Relation Age of Onset  . Lung cancer Mother   . Lung cancer Paternal Grandfather   . Lung cancer Paternal Grandmother   . Heart attack Father   . Heart disease Maternal Grandmother   . Depression Maternal Grandfather   . Diabetes Maternal Grandfather     History   Social History  . Marital Status: Married    Spouse Name: N/A    Number of Children: N/A  . Years of Education: N/A   Occupational History  . Not on file.   Social History Main Topics    . Smoking status: Current Every Day Smoker -- 0.50 packs/day    Types: Cigarettes  . Smokeless tobacco: Never Used  . Alcohol Use: Yes     Comment: occasional  . Drug Use: No  . Sexual Activity: Not on file   Other Topics Concern  . Not on file   Social History Narrative  . No narrative on file     Constitutional: Pt reports fatigue. Denies fever, malaise,  headache or abrupt weight changes.  Neurological: Denies dizziness, difficulty with memory, difficulty with speech or problems with balance and coordination.  Psych: Pt reports stress and depression. Denies SI/HI.  No other specific complaints in a complete review of systems (except as listed in HPI above).  Objective:   Physical Exam   BP 114/70  Pulse 109  Temp(Src) 98 F (36.7 C) (Oral)  Wt 121 lb (54.885 kg)  SpO2 99% Wt Readings from Last 3 Encounters:  09/19/13 121 lb (54.885 kg)  08/15/13 123 lb 4 oz (55.906 kg)  08/04/13 126 lb 12 oz (57.493 kg)    General: Appears her stated age, well developed, well nourished in NAD. Cardiovascular: Normal rate and rhythm. S1,S2 noted.  No murmur, rubs or gallops noted. . Pulmonary/Chest: Normal effort and positive vesicular breath sounds. No respiratory distress. No wheezes, rales or ronchi noted.  Psychiatric: Mood (holding back tears) but affect normal. Behavior is normal. Judgment and thought content normal.     BMET    Component Value Date/Time   NA 135 08/15/2013 1000   NA 142 04/24/2013 1432   K 4.1 08/15/2013 1000   CL 102 08/15/2013 1000   CO2 23 08/15/2013 1000   GLUCOSE 56* 08/15/2013 1000   GLUCOSE 75 04/24/2013 1432   BUN 20 08/15/2013 1000   BUN 9 04/24/2013 1432   CREATININE 0.8 08/15/2013 1000   CALCIUM 10.4 08/15/2013 1000   GFRNONAA >90 07/29/2013 1800   GFRAA >90 07/29/2013 1800    Lipid Panel     Component Value Date/Time   TRIG 240* 04/24/2013 1432   HDL 44 04/24/2013 1432   CHOLHDL 3.1 04/24/2013 1432   LDLCALC 46 04/24/2013 1432    CBC     Component Value Date/Time   WBC 12.4* 07/29/2013 1800   WBC CANCELED 04/24/2013 1432   RBC 4.48 07/29/2013 1800   RBC CANCELED 04/24/2013 1432   HGB 14.4 07/29/2013 1800   HCT 41.8 07/29/2013 1800   PLT 360 07/29/2013 1800   MCV 93.3 07/29/2013 1800   MCH 32.1 07/29/2013 1800   MCHC 34.4 07/29/2013 1800   RDW 12.4 07/29/2013 1800   LYMPHSABS CANCELED 04/24/2013 1432   EOSABS CANCELED 04/24/2013 1432   BASOSABS CANCELED 04/24/2013 1432    Hgb A1C Lab Results  Component Value Date   HGBA1C 5.7 08/15/2013        Assessment & Plan:

## 2013-09-19 NOTE — Assessment & Plan Note (Signed)
Adderall increase did help some but not as effective now She never started wellbutrin (although I was supposed to call in zoloft) There are major drug interactions between SSRI and amphetamines Discussed possibility of trying counseling- she does not want to do this She reports that she does not have a good support network, no friends to talk to Support offered today Will discuss alternatives with Dr. Dayton Martes and get back with you

## 2013-09-19 NOTE — Progress Notes (Signed)
Pre visit review using our clinic review tool, if applicable. No additional management support is needed unless otherwise documented below in the visit note. 

## 2013-09-23 ENCOUNTER — Telehealth: Payer: Self-pay | Admitting: Internal Medicine

## 2013-09-23 DIAGNOSIS — F329 Major depressive disorder, single episode, unspecified: Secondary | ICD-10-CM

## 2013-09-23 DIAGNOSIS — F32A Depression, unspecified: Secondary | ICD-10-CM

## 2013-09-23 NOTE — Telephone Encounter (Signed)
Pt called back and is extremely frustrated b/c she was told that she would hear something back by Monday and is feeling very irritable and depressed. She does not like feeling this way and really would like a call back this afternoon with what Dr. Dayton Martes has said.   Dr. Dayton Martes, could you please review pt's chart and give pt a call with her concerns?  Please advise

## 2013-09-23 NOTE — Addendum Note (Signed)
Addended by: Dianne Dun on: 09/23/2013 05:17 PM   Modules accepted: Orders

## 2013-09-23 NOTE — Telephone Encounter (Signed)
Sorry.  I saw her note but for whatever reason, I did not see [name]'s message to me attached.  [name] mentioned that she did not want psychotherapy and I agree that we are limited in terms of her medications based on what she is currently taking.  I strongly urge her to see a psychiatrist.  I attempted to call patient- no answer on number listed.  Spoke to patient's sister, Hoy Morn.  She states sister is not suicidal or homicidal.  She feels she is just decompensating from all of her acute stressors.  She agreed that both pscyhiatry and psychotherapy warranted at this point.  Referrals placed.

## 2013-09-23 NOTE — Telephone Encounter (Signed)
Spoke to pts sister Hoy Morn, who was very upset that the pt has not received a call back. Sister states that pt is very irritable and is she is concerned about what she should do next. Dr Dayton Martes is out of office for the remainder of the day, and sister verbally expressed understanding, and was advised that she WILL receive a call back on tomorrow. Pt was sched for 9/24 in the event that she needs to be seen before there is no other availability.

## 2013-09-23 NOTE — Telephone Encounter (Signed)
I have sent her a message but not yet heard back from her.

## 2013-09-23 NOTE — Telephone Encounter (Signed)
Seham called to check and see if you had consulted with Dr. Dayton Martes about her visit on 09/19/13. Please call pt back on cell phone.

## 2013-09-24 ENCOUNTER — Encounter: Payer: Self-pay | Admitting: Family Medicine

## 2013-09-25 ENCOUNTER — Telehealth: Payer: Self-pay | Admitting: Family Medicine

## 2013-09-25 ENCOUNTER — Ambulatory Visit (INDEPENDENT_AMBULATORY_CARE_PROVIDER_SITE_OTHER): Payer: Medicaid Other | Admitting: Family Medicine

## 2013-09-25 ENCOUNTER — Encounter: Payer: Self-pay | Admitting: Family Medicine

## 2013-09-25 VITALS — BP 120/74 | HR 119 | Temp 98.1°F | Wt 124.0 lb

## 2013-09-25 DIAGNOSIS — F32A Depression, unspecified: Secondary | ICD-10-CM

## 2013-09-25 DIAGNOSIS — F341 Dysthymic disorder: Secondary | ICD-10-CM

## 2013-09-25 DIAGNOSIS — F329 Major depressive disorder, single episode, unspecified: Secondary | ICD-10-CM

## 2013-09-25 DIAGNOSIS — F4321 Adjustment disorder with depressed mood: Secondary | ICD-10-CM

## 2013-09-25 DIAGNOSIS — Z634 Disappearance and death of family member: Secondary | ICD-10-CM

## 2013-09-25 DIAGNOSIS — F419 Anxiety disorder, unspecified: Principal | ICD-10-CM

## 2013-09-25 DIAGNOSIS — F4329 Adjustment disorder with other symptoms: Secondary | ICD-10-CM

## 2013-09-25 MED ORDER — AMPHETAMINE-DEXTROAMPHETAMINE 20 MG PO TABS: 20.0000 mg | ORAL_TABLET | Freq: Two times a day (BID) | ORAL | Status: DC

## 2013-09-25 MED ORDER — AMPHETAMINE-DEXTROAMPHETAMINE 15 MG PO TABS: 15.0000 mg | ORAL_TABLET | Freq: Two times a day (BID) | ORAL | Status: DC

## 2013-09-25 NOTE — Telephone Encounter (Signed)
Pt stated that she was just in the office today, and wanted to see if reducing adderall would help with her issues.

## 2013-09-25 NOTE — Progress Notes (Signed)
Pre visit review using our clinic review tool, if applicable. No additional management support is needed unless otherwise documented below in the visit note. 

## 2013-09-25 NOTE — Progress Notes (Signed)
Subjective:   Patient ID: Catherine [name], female    DOB: 14-Feb-1985, 28 y.o.   MRN: [id number]  Catherine [name] is a pleasant 28 y.o. year old female who presents to clinic today with Depression, Fussy and Fatigue  on 09/25/2013  HPI: Very pleasant 28 yo female here for follow up.  Since having implanon inserted, she has felt more irritable and depressed. She has been nervous about trying any other rx given her reaction to combination of chantix with ETOH.  Spoke with patient's sister over the phone as well- Catherine Nam, RN.  She and pt both agree that she is not suicidal or homicidal.  Has had increased stressors- court date with her ex is looming and both Catherine [name] and her sister feel she has never really mourned the death of her parents.  She went back to OBGYN last Thursday and had Implanon removed.  Already feels better.  Still feels irritable- snapped at her son this morning, but feels "more like herself."  No current outpatient prescriptions on file prior to visit.   No current facility-administered medications on file prior to visit.    Allergies  Allergen Reactions  . Chantix [Varenicline]     Altered mental status     Past Medical History  Diagnosis Date  . History of depression   . History of headache   . History of gastroesophageal reflux (GERD)   . History of migraine   . History of colon polyps     No past surgical history on file.  Family History  Problem Relation Age of Onset  . Lung cancer Mother   . Lung cancer Paternal Grandfather   . Lung cancer Paternal Grandmother   . Heart attack Father   . Heart disease Maternal Grandmother   . Depression Maternal Grandfather   . Diabetes Maternal Grandfather     History   Social History  . Marital Status: Married    Spouse Name: N/A    Number of Children: N/A  . Years of Education: N/A   Occupational History  . Not on file.   Social History Main Topics  . Smoking status: Current Every Day  Smoker -- 0.50 packs/day    Types: Cigarettes  . Smokeless tobacco: Never Used  . Alcohol Use: Yes     Comment: occasional  . Drug Use: Yes  . Sexual Activity: Not on file   Other Topics Concern  . Not on file   Social History Narrative  . No narrative on file   The PMH, PSH, Social History, Family History, Medications, and allergies have been reviewed in [facility], and have been updated if relevant.   Review of Systems    See HPI  +tearfulness Appetite good weight stable- Wt Readings from Last 3 Encounters:  09/25/13 124 lb (56.246 kg)  09/19/13 121 lb (54.885 kg)  08/15/13 123 lb 4 oz (55.906 kg)   Not sleeping much so very tired the next day She does have a job now so she feels less anxious about finances Objective:    BP 120/74  Pulse 119  Temp(Src) 98.1 F (36.7 C) (Oral)  Wt 124 lb (56.246 kg)  SpO2 98%   Physical Exam  Nursing note and vitals reviewed. Constitutional: She appears well-developed and well-nourished. No distress.  Skin: Skin is warm and dry.  Psychiatric: She has a normal mood and affect. Her behavior is normal. Judgment and thought content normal.          Assessment & Plan:  Anxiety and depression  Complicated grief No Follow-up on file.

## 2013-09-25 NOTE — Telephone Encounter (Signed)
We could certainly try a lower dose if she would like.

## 2013-09-25 NOTE — Assessment & Plan Note (Signed)
>  25 minutes spent in face to face time with patient, >50% spent in counselling or coordination of care Discussed importance of both psychiatric evaluation and psychotherapy- she is agreement with both and referrals placed. Sister is very supportive.

## 2013-09-25 NOTE — Telephone Encounter (Signed)
Ok to print out rx as entered below and leave in my box for signature.

## 2013-09-25 NOTE — Telephone Encounter (Signed)
Spoke to pt and advised per Dr Dayton Martes. Pt states that she is wanting to try reducing dosages to see if there is any change. Pt advised not to fill new Rx as she will need to return it back to me when she receives new script

## 2013-09-26 NOTE — Telephone Encounter (Signed)
Spoke to pt and informed her Rx is available for pickup from the front desk. Pt advised that she must bring old Rx back to office in order to receive new one.

## 2013-10-15 ENCOUNTER — Encounter: Payer: Self-pay | Admitting: Family Medicine

## 2013-10-17 ENCOUNTER — Other Ambulatory Visit: Payer: Self-pay | Admitting: Family Medicine

## 2013-10-17 NOTE — Telephone Encounter (Signed)
Pt sent mychart message asking for increased dose of adderall.  Please print out rx as entered below.

## 2013-10-17 NOTE — Telephone Encounter (Signed)
I looked at this pts chart, and all of her meds have been d/c. Do you know if there is a reason why? She will have to wait until you return on Monday as this would be considered a new script

## 2013-10-20 MED ORDER — AMPHETAMINE-DEXTROAMPHETAMINE 20 MG PO TABS: 20.0000 mg | ORAL_TABLET | Freq: Two times a day (BID) | ORAL | Status: DC

## 2013-10-20 NOTE — Telephone Encounter (Signed)
My chart message sent to pt informing her Rx is available for pickup from the front desk

## 2013-11-13 ENCOUNTER — Encounter: Payer: Self-pay | Admitting: Family Medicine

## 2013-11-13 ENCOUNTER — Ambulatory Visit (INDEPENDENT_AMBULATORY_CARE_PROVIDER_SITE_OTHER): Payer: Medicaid Other | Admitting: Family Medicine

## 2013-11-13 ENCOUNTER — Telehealth: Payer: Self-pay

## 2013-11-13 VITALS — BP 122/84 | HR 108 | Temp 98.0°F | Wt 126.8 lb

## 2013-11-13 DIAGNOSIS — J029 Acute pharyngitis, unspecified: Secondary | ICD-10-CM | POA: Insufficient documentation

## 2013-11-13 LAB — POCT RAPID STREP A (OFFICE): Rapid Strep A Screen: NEGATIVE

## 2013-11-13 MED ORDER — MAGIC MOUTHWASH W/LIDOCAINE: 5.0000 mL | Freq: Three times a day (TID) | ORAL | Status: DC | PRN

## 2013-11-13 NOTE — Telephone Encounter (Signed)
Should be 1:1 ratio of Lidocaine: maalox.

## 2013-11-13 NOTE — Progress Notes (Signed)
Pre visit review using our clinic review tool, if applicable. No additional management support is needed unless otherwise documented below in the visit note. 

## 2013-11-13 NOTE — Telephone Encounter (Signed)
[place] of ratio for med

## 2013-11-13 NOTE — Assessment & Plan Note (Signed)
New- likely viral, exacerbated by vomiting. Rapid strep neg. Advised supportive care with NSAIDs and magic mouthwash rx given to be used prn. Call or return to clinic prn if these symptoms worsen or fail to improve as anticipated. The patient indicates understanding of these issues and agrees with the plan.

## 2013-11-13 NOTE — Telephone Encounter (Signed)
Jennifer with [employer] request cb with how much lidocaine in the magic mouthwash.

## 2013-11-13 NOTE — Progress Notes (Signed)
   Subjective:   Patient ID: Catherine [name], female    DOB: August 31, 1985, 28 y.o.   MRN: [id number]  Catherine NordmannMiranda Glanz is a pleasant 28 y.o. year old female who presents to clinic today with Sore Throat and Fever  on 11/13/2013  HPI: Had nausea, vomiting, diarrhea end of last week.   All of those symptoms have resolved but now has a sore throat. No difficulty swallowing but there is pain with swallowing. No fevers. No abdominal pain. No ear pain. No cough.  Current Outpatient Prescriptions on File Prior to Visit  Medication Sig Dispense Refill  . amphetamine-dextroamphetamine (ADDERALL) 20 MG tablet Take 1 tablet (20 mg total) by mouth 2 (two) times daily. 60 tablet 0   No current facility-administered medications on file prior to visit.    Allergies  Allergen Reactions  . Chantix [Varenicline]     Altered mental status     Past Medical History  Diagnosis Date  . History of depression   . History of headache   . History of gastroesophageal reflux (GERD)   . History of migraine   . History of colon polyps     No past surgical history on file.  Family History  Problem Relation Age of Onset  . Lung cancer Mother   . Lung cancer Paternal Grandfather   . Lung cancer Paternal Grandmother   . Heart attack Father   . Heart disease Maternal Grandmother   . Depression Maternal Grandfather   . Diabetes Maternal Grandfather     History   Social History  . Marital Status: Married    Spouse Name: N/A    Number of Children: N/A  . Years of Education: N/A   Occupational History  . Not on file.   Social History Main Topics  . Smoking status: Current Every Day Smoker -- 0.50 packs/day    Types: Cigarettes  . Smokeless tobacco: Never Used  . Alcohol Use: Yes     Comment: occasional  . Drug Use: Yes  . Sexual Activity: Not on file   Other Topics Concern  . Not on file   Social History Narrative   The PMH, PSH, Social History, Family History, Medications, and  allergies [facility], and have been updated if relevant.  Review of Systems  Constitutional: Negative for fever, chills and fatigue.  HENT: Negative for congestion.   Respiratory: Negative.   Cardiovascular: Negative.   All other systems reviewed and are negative.      Objective:    BP 122/84 mmHg  Pulse 108  Temp(Src) 98 F (36.7 C) (Oral)  Wt 126 lb 12 oz (57.493 kg)  SpO2 98%  LMP 11/06/2013   Physical Exam  Constitutional: She appears well-developed and well-nourished. No distress.  HENT:  Head: Normocephalic.  Right Ear: Hearing and tympanic membrane normal.  Left Ear: Hearing and tympanic membrane normal.  Mouth/Throat: Uvula is midline. Posterior oropharyngeal erythema present. No oropharyngeal exudate, posterior oropharyngeal edema or tonsillar abscesses.  Eyes: Pupils are equal, round, and reactive to light.  Skin: Skin is warm, dry and intact.  Psychiatric: She has a normal mood and affect. Her speech is normal and behavior is normal. Judgment and thought content normal. Cognition and memory are normal.  Nursing note and vitals reviewed.         Assessment & Plan:   Sore throat - Plan: Rapid Strep A  Pharyngitis No Follow-up on file.

## 2013-11-16 ENCOUNTER — Other Ambulatory Visit: Payer: Self-pay | Admitting: Family Medicine

## 2013-11-17 MED ORDER — AMPHETAMINE-DEXTROAMPHETAMINE 20 MG PO TABS: 20.0000 mg | ORAL_TABLET | Freq: Two times a day (BID) | ORAL | Status: DC

## 2013-11-17 NOTE — Telephone Encounter (Signed)
Pt requesting medication refill. Last f/u appt 09/2013. Ok to fill per Dr Dayton MartesAron. Spoke to pt and informed her Rx is available for pickup from the front desk

## 2013-12-10 ENCOUNTER — Other Ambulatory Visit: Payer: Self-pay | Admitting: Family Medicine

## 2013-12-14 ENCOUNTER — Encounter: Payer: Self-pay | Admitting: Family Medicine

## 2013-12-15 ENCOUNTER — Encounter: Payer: Self-pay | Admitting: Family Medicine

## 2013-12-15 ENCOUNTER — Other Ambulatory Visit: Payer: Self-pay | Admitting: Family Medicine

## 2013-12-15 ENCOUNTER — Telehealth: Payer: Self-pay

## 2013-12-15 ENCOUNTER — Ambulatory Visit: Payer: Medicaid Other | Admitting: Family Medicine

## 2013-12-15 MED ORDER — AMPHETAMINE-DEXTROAMPHETAMINE 20 MG PO TABS: 20.0000 mg | ORAL_TABLET | Freq: Two times a day (BID) | ORAL | Status: DC

## 2013-12-15 NOTE — Telephone Encounter (Signed)
Pt spoke to CAN and has been placed on the schedule

## 2013-12-15 NOTE — Progress Notes (Signed)
Pre visit review using our clinic review tool, if applicable. No additional management support is needed unless otherwise documented below in the visit note. 

## 2013-12-15 NOTE — Addendum Note (Signed)
Addended by: Desmond DikeKNIGHT, Faylynn Stamos H on: 12/15/2013 03:48 PM   Modules accepted: Orders, Medications

## 2013-12-15 NOTE — Telephone Encounter (Signed)
PLEASE NOTE: All timestamps contained within this report are represented as [place] Time. CONFIDENTIALTY NOTICE: This fax transmission is intended only for the addressee. It contains information that is legally privileged, confidential or otherwise protected from use or disclosure. If you are not the intended recipient, you are strictly prohibited from reviewing, disclosing, copying using or disseminating any of this information or taking any action in reliance on or regarding this information. If you have received this fax in error, please notify us immediately by telephone so that we can arrange for its return to us. Phone: [phone], Toll-Free: [phone], Fax: [phone] Page: 1 of 2 Call Id: [id number] [facility] - Client TELEPHONE ADVICE RECORD [facility] Patient Name: Julien NordmannMIRANDA Ternes Gender: Female DOB: [birth year] Age: [age] Y 6 M 21 D Return Phone Number: [phone] (Primary) Address: [address] City/State/Zip: [place] [id number] Client [facility] Client Site [facility] - Day Physician Ruthe MannanAron, Talia Contact Type Call Call Type Triage / Clinical Relationship To Patient Self Return Phone Number [phone] (Primary) Chief Complaint Back Pain - General Initial Comment Caller states she had an episode of tightness/ pressure in her chest and she was disorientated. Feels fine right now only having back pain. PreDisposition Call Doctor Nurse Assessment Nurse: Stefano GaulStringer, RN, Dwana CurdVera Date/Time Lamount Cohen(Eastern Time): 12/15/2013 8:30:34 AM Confirm and document reason for call. If symptomatic, describe symptoms. ---Caller states she had episode of tightness/pressure in the center of her chest. Had a stabbing pain that radiated toward her back. Her heart was beating fast. She feels better now. Pain started around 9:30 pm and lasted until 11:30 pm. Has happened in the past.  Pain has gotten more intense. No symptoms now. Has the patient traveled out of the country within the last 30 days? ---Not Applicable Does the patient require triage? ---Yes Related visit to physician within the last 2 weeks? ---No Does the PT have any chronic conditions? (i.e. diabetes, asthma, etc.) ---No Did the patient indicate they were pregnant? ---No Guidelines Guideline Title Affirmed Question Affirmed Notes Nurse Date/Time Lamount Cohen(Eastern Time) Chest Pain Chest pain lasts > 5 minutes (Exceptions: chest pain occurring > 3 days ago and now asymptomatic; same as previously diagnosed heartburn and has accompanying sour taste in mouth) Stefano GaulStringer, RN, Vera 12/15/2013 8:34:01 AM Disp. Time Lamount Cohen(Eastern Time) Disposition Final User 12/15/2013 8:42:28 AM Go to ED Now (or PCP triage) Yes Stefano GaulStringer, RN, Vera PLEASE NOTE: All timestamps contained within this report are represented as [place] Standard Time. CONFIDENTIALTY NOTICE: This fax transmission is intended only for the addressee. It contains information that is legally privileged, confidential or otherwise protected from use or disclosure. If you are not the intended recipient, you are strictly prohibited from reviewing, disclosing, copying using or disseminating any of this information or taking any action in reliance on or regarding this information. If you have received this fax in error, please notify us immediately by telephone so that we can arrange for its return to us. Phone: [phone], Toll-Free: [phone], Fax: [phone] Page: 2 of 2 Call Id: [id number] Caller Understands: Yes Disagree/Comply: Comply Care Advice Given Per Guideline * IF NO PCP TRIAGE: You need to be seen. [facility] within the next hour. Leave as soon as you can. GO TO ED NOW (OR PCP TRIAGE): CARE ADVICE given per Chest Pain (Adult) guideline. After Care Instructions Given Call Event Type User Date / Time  Description  Comments User: Art BuffVera, Stringer, RN Date/Time Lamount Cohen(Eastern Time): 12/15/2013 8:43:30 AM Called back line at office and spoke to [place] who spoke to Dr. Dayton MartesAron who said pt can come to office now. Pt notified and verbalized understanding. Referrals REFERRED TO PCP OFFICE

## 2013-12-15 NOTE — Telephone Encounter (Signed)
Spoke to pt and informed her Rx is available for pickup from the front desk. Pt advised that this is the last Rx that she is able to receive from Dr Dayton MartesAron. Pt now has [place] [facility] and must be seen by party listed on card.

## 2014-01-13 ENCOUNTER — Encounter: Payer: Self-pay | Admitting: Family Medicine

## 2014-01-15 ENCOUNTER — Emergency Department: Payer: Self-pay | Admitting: Student

## 2014-01-27 NOTE — Telephone Encounter (Signed)
Patient states she needs Adderall filled. She has an appointment with a Psychiatrist in mid-March.  If you need the patient to have a drug test done, she can get it done and [employer] will have to write off the bill.  I told the patient we will call her back and let her know what to do.

## 2014-01-28 ENCOUNTER — Encounter: Payer: Self-pay | Admitting: Family Medicine

## 2014-01-28 MED ORDER — AMPHETAMINE-DEXTROAMPHETAMINE 20 MG PO TABS: 20.0000 mg | ORAL_TABLET | Freq: Two times a day (BID) | ORAL | Status: DC

## 2014-01-28 NOTE — Telephone Encounter (Signed)
Pt needs Adderall refilled.  °

## 2014-01-28 NOTE — Telephone Encounter (Signed)
Ok to refill Adderall one time only.  [facility]needs [facility] prior to pick up.

## 2014-02-16 ENCOUNTER — Emergency Department: Payer: Self-pay | Admitting: Student

## 2014-02-20 ENCOUNTER — Encounter: Payer: Self-pay | Admitting: Internal Medicine

## 2014-12-24 ENCOUNTER — Emergency Department
Admission: EM | Admit: 2014-12-24 | Discharge: 2014-12-24 | Disposition: A | Discharge status: Home / Self Care | Payer: Medicaid Other | Attending: Emergency Medicine | Admitting: Emergency Medicine

## 2014-12-24 DIAGNOSIS — Z79899 Other long term (current) drug therapy: Secondary | ICD-10-CM | POA: Insufficient documentation

## 2014-12-24 DIAGNOSIS — Z760 Encounter for issue of repeat prescription: Secondary | ICD-10-CM | POA: Diagnosis present

## 2014-12-24 DIAGNOSIS — F17210 Nicotine dependence, cigarettes, uncomplicated: Secondary | ICD-10-CM | POA: Insufficient documentation

## 2014-12-24 MED ORDER — AMPHETAMINE-DEXTROAMPHETAMINE 20 MG PO TABS: 20.0000 mg | ORAL_TABLET | Freq: Every day | ORAL | Status: DC

## 2014-12-24 NOTE — ED Notes (Signed)
Info sheet for [facility] given to patient at discharge

## 2014-12-24 NOTE — ED Notes (Signed)
Pt needs refill of adderall. Pt taken last 12/08/14.

## 2014-12-24 NOTE — Discharge Instructions (Signed)
Medicine Refill at the Emergency Department We have refilled your medicine today, but it is best for you to get refills through your primary health care provider's office. In the future, please plan ahead so you do not need to get refills from the emergency department. If the medicine we refilled was a maintenance medicine, you may have received only enough to get you by until you are able to see your regular health care provider.   This information is not intended to replace advice given to you by your health care provider. Make sure you discuss any questions you have with your health care provider.   Document Released: 04/07/2003 Document Revised: 01/09/2014 Document Reviewed: 03/28/2013 Elsevier Interactive Patient Education 2016 [employer]   You must follow-up with your provider to secure an earlier appointment, or a short supply until your appointment. Information is also provided for [facility] for your convenience.
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
## 2014-12-24 NOTE — ED Notes (Signed)
Ran out of adderall on dec 6

## 2015-12-28 ENCOUNTER — Encounter: Payer: Self-pay | Admitting: Emergency Medicine

## 2015-12-28 ENCOUNTER — Emergency Department
Admission: EM | Admit: 2015-12-28 | Discharge: 2015-12-28 | Disposition: A | Discharge status: Home / Self Care | Payer: Medicaid Other | Attending: Student in an Organized Health Care Education/Training Program | Admitting: Student in an Organized Health Care Education/Training Program

## 2015-12-28 DIAGNOSIS — F909 Attention-deficit hyperactivity disorder, unspecified type: Secondary | ICD-10-CM | POA: Diagnosis not present

## 2015-12-28 DIAGNOSIS — F17210 Nicotine dependence, cigarettes, uncomplicated: Secondary | ICD-10-CM | POA: Diagnosis not present

## 2015-12-28 DIAGNOSIS — Z760 Encounter for issue of repeat prescription: Secondary | ICD-10-CM | POA: Diagnosis present

## 2015-12-28 DIAGNOSIS — Z79899 Other long term (current) drug therapy: Secondary | ICD-10-CM | POA: Insufficient documentation

## 2015-12-28 MED ORDER — HYDROXYZINE HCL 25 MG PO TABS: 25.0000 mg | ORAL_TABLET | Freq: Four times a day (QID) | ORAL | 0 refills | Status: DC | PRN

## 2015-12-28 NOTE — ED Notes (Signed)
Pt is here for a refill on her Adderall, MD is out of the office for 2 weeks

## 2015-12-28 NOTE — ED Triage Notes (Signed)
Pt reports she's out of her adderall. Pt reports PCP is out of office x2 weeks.
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
## 2016-04-27 ENCOUNTER — Ambulatory Visit: Payer: Self-pay | Admitting: Obstetrics and Gynecology

## 2016-11-02 ENCOUNTER — Inpatient Hospital Stay (HOSPITAL_COMMUNITY)
Admission: AD | Admit: 2016-11-02 | Discharge: 2016-11-02 | Disposition: A | Discharge status: Home / Self Care | Payer: Medicaid Other | Source: Ambulatory Visit | Attending: Obstetrics and Gynecology | Admitting: Obstetrics and Gynecology

## 2016-11-02 ENCOUNTER — Encounter (HOSPITAL_COMMUNITY): Payer: Self-pay | Admitting: *Deleted

## 2016-11-02 ENCOUNTER — Telehealth: Payer: Self-pay | Admitting: Radiology

## 2016-11-02 ENCOUNTER — Inpatient Hospital Stay (HOSPITAL_COMMUNITY): Payer: Medicaid Other

## 2016-11-02 DIAGNOSIS — Z3A38 38 weeks gestation of pregnancy: Secondary | ICD-10-CM | POA: Diagnosis not present

## 2016-11-02 DIAGNOSIS — O99333 Smoking (tobacco) complicating pregnancy, third trimester: Secondary | ICD-10-CM | POA: Insufficient documentation

## 2016-11-02 DIAGNOSIS — O99343 Other mental disorders complicating pregnancy, third trimester: Secondary | ICD-10-CM | POA: Insufficient documentation

## 2016-11-02 DIAGNOSIS — O26893 Other specified pregnancy related conditions, third trimester: Secondary | ICD-10-CM | POA: Insufficient documentation

## 2016-11-02 DIAGNOSIS — Z79899 Other long term (current) drug therapy: Secondary | ICD-10-CM | POA: Insufficient documentation

## 2016-11-02 DIAGNOSIS — R109 Unspecified abdominal pain: Secondary | ICD-10-CM | POA: Insufficient documentation

## 2016-11-02 DIAGNOSIS — O0933 Supervision of pregnancy with insufficient antenatal care, third trimester: Secondary | ICD-10-CM | POA: Diagnosis not present

## 2016-11-02 DIAGNOSIS — N760 Acute vaginitis: Secondary | ICD-10-CM | POA: Insufficient documentation

## 2016-11-02 DIAGNOSIS — B9689 Other specified bacterial agents as the cause of diseases classified elsewhere: Secondary | ICD-10-CM | POA: Insufficient documentation

## 2016-11-02 DIAGNOSIS — Z3A35 35 weeks gestation of pregnancy: Secondary | ICD-10-CM | POA: Diagnosis not present

## 2016-11-02 DIAGNOSIS — K219 Gastro-esophageal reflux disease without esophagitis: Secondary | ICD-10-CM | POA: Diagnosis not present

## 2016-11-02 DIAGNOSIS — Z3493 Encounter for supervision of normal pregnancy, unspecified, third trimester: Secondary | ICD-10-CM

## 2016-11-02 DIAGNOSIS — F329 Major depressive disorder, single episode, unspecified: Secondary | ICD-10-CM | POA: Diagnosis not present

## 2016-11-02 DIAGNOSIS — F17210 Nicotine dependence, cigarettes, uncomplicated: Secondary | ICD-10-CM | POA: Diagnosis not present

## 2016-11-02 DIAGNOSIS — O99613 Diseases of the digestive system complicating pregnancy, third trimester: Secondary | ICD-10-CM | POA: Insufficient documentation

## 2016-11-02 LAB — CBC
HEMATOCRIT: 24.9 % — AB (ref 36.0–46.0)
HEMOGLOBIN: 8.7 g/dL — AB (ref 12.0–15.0)
MCH: 30 pg (ref 26.0–34.0)
MCHC: 34.9 g/dL (ref 30.0–36.0)
MCV: 85.9 fL (ref 78.0–100.0)
PLATELETS: 341 10*3/uL (ref 150–400)
RBC: 2.9 MIL/uL — AB (ref 3.87–5.11)
RDW: 13.5 % (ref 11.5–15.5)
WBC: 9.1 10*3/uL (ref 4.0–10.5)

## 2016-11-02 LAB — DIFFERENTIAL
Basophils Absolute: 0 10*3/uL (ref 0.0–0.1)
Basophils Relative: 0 %
Eosinophils Absolute: 0.1 10*3/uL (ref 0.0–0.7)
Eosinophils Relative: 1 %
LYMPHS PCT: 21 %
Lymphs Abs: 1.9 10*3/uL (ref 0.7–4.0)
MONO ABS: 0.4 10*3/uL (ref 0.1–1.0)
Monocytes Relative: 4 %
NEUTROS ABS: 6.7 10*3/uL (ref 1.7–7.7)
Neutrophils Relative %: 74 %

## 2016-11-02 LAB — URINALYSIS, ROUTINE W REFLEX MICROSCOPIC
BILIRUBIN URINE: NEGATIVE
Glucose, UA: NEGATIVE mg/dL
HGB URINE DIPSTICK: NEGATIVE
KETONES UR: NEGATIVE mg/dL
Nitrite: NEGATIVE
PROTEIN: NEGATIVE mg/dL
Specific Gravity, Urine: 1.005 (ref 1.005–1.030)
pH: 6 (ref 5.0–8.0)

## 2016-11-02 LAB — COMPREHENSIVE METABOLIC PANEL
ALBUMIN: 2.8 g/dL — AB (ref 3.5–5.0)
ALK PHOS: 223 U/L — AB (ref 38–126)
ALT: 10 U/L — ABNORMAL LOW (ref 14–54)
ANION GAP: 8 (ref 5–15)
AST: 15 U/L (ref 15–41)
BUN: 5 mg/dL — ABNORMAL LOW (ref 6–20)
CO2: 20 mmol/L — AB (ref 22–32)
Calcium: 8.5 mg/dL — ABNORMAL LOW (ref 8.9–10.3)
Chloride: 105 mmol/L (ref 101–111)
Creatinine, Ser: 0.53 mg/dL (ref 0.44–1.00)
GFR calc Af Amer: >60 mL/min (ref >60)
GFR calc non Af Amer: >60 mL/min (ref >60)
GLUCOSE: 93 mg/dL (ref 65–99)
Potassium: 4.1 mmol/L (ref 3.5–5.1)
SODIUM: 133 mmol/L — AB (ref 135–145)
Total Bilirubin: 0.3 mg/dL (ref 0.3–1.2)
Total Protein: 5.9 g/dL — ABNORMAL LOW (ref 6.5–8.1)

## 2016-11-02 LAB — ABO/RH: ABO/RH(D): O NEG

## 2016-11-02 LAB — PROTEIN / CREATININE RATIO, URINE
Creatinine, Urine: 37 mg/dL
Total Protein, Urine: <6 mg/dL

## 2016-11-02 LAB — WET PREP, GENITAL
Sperm: NONE SEEN
TRICH WET PREP: NONE SEEN
YEAST WET PREP: NONE SEEN

## 2016-11-02 LAB — TYPE AND SCREEN
ABO/RH(D): O NEG
Antibody Screen: NEGATIVE

## 2016-11-02 LAB — POCT FERN TEST: POCT Fern Test: NEGATIVE

## 2016-11-02 LAB — RAPID URINE DRUG SCREEN, HOSP PERFORMED
Amphetamines: POSITIVE — AB
Barbiturates: NOT DETECTED
Benzodiazepines: NOT DETECTED
Cocaine: NOT DETECTED
OPIATES: NOT DETECTED
TETRAHYDROCANNABINOL: NOT DETECTED

## 2016-11-02 MED ORDER — RHO D IMMUNE GLOBULIN 1500 UNIT/2ML IJ SOSY
300.0000 ug | PREFILLED_SYRINGE | Freq: Once | INTRAMUSCULAR | Status: AC
  Administered 2016-11-02: 300 ug via INTRAMUSCULAR
  Filled 2016-11-02: qty 2

## 2016-11-02 MED ORDER — METRONIDAZOLE 500 MG PO TABS: 500.0000 mg | ORAL_TABLET | Freq: Two times a day (BID) | ORAL | 0 refills | Status: DC

## 2016-11-02 MED ORDER — PRENATAL VITAMIN 27-0.8 MG PO TABS: 1.0000 | ORAL_TABLET | Freq: Every day | ORAL | 1 refills | Status: DC

## 2016-11-02 NOTE — Telephone Encounter (Signed)
Left message on the cell phone voicemail to call [id number] to schedule new OB appointment (approx 35 Wk).

## 2016-11-02 NOTE — [facility] Provider Note (Signed)
History     CSN: [id number]  Arrival date and time: 11/02/16 1119   First Provider Initiated Contact with Patient 11/02/16 1222     Chief Complaint  Patient presents with  . vaginal pressure   HPI Catherine [name] is a 31 y.o. G3P2002 at unknown gestational age who presents with vaginal pressure. Patient states she has known she is pregnant "for a while." She states she is unsure who the father is and was in denial so she did not seek any prenatal care. She has a history of irregular periods and is unsure of last period. States she had intercourse 2 times in February and March and feels like that is when she became pregnant. She reports one episode of bleeding in July and none since. She reports a clear watery vaginal discharge for the last week. She denies any pain. She reports feeling fetal movement. She has a history of 2 SVDs at [facility]. She reports using zoloft, adderall and alcohol throughout the pregnancy.   OB History    Gravida Para Term Preterm AB Living   3 2 2     2    SAB TAB Ectopic Multiple Live Births           2      Past Medical History:  Diagnosis Date  . History of colon polyps   . History of depression   . History of gastroesophageal reflux (GERD)   . History of headache   . History of migraine     History reviewed. No pertinent surgical history.  Family History  Problem Relation Age of Onset  . Lung cancer Mother   . Heart attack Father   . Lung cancer Paternal Grandfather   . Lung cancer Paternal Grandmother   . Heart disease Maternal Grandmother   . Depression Maternal Grandfather   . Diabetes Maternal Grandfather     Social History  Substance Use Topics  . Smoking status: Current Every Day Smoker    Packs/day: 0.50    Types: Cigarettes  . Smokeless tobacco: Never Used  . Alcohol use Yes     Comment: occasional    Allergies:  Allergies  Allergen Reactions  . Chantix [Varenicline]     Altered mental status      Prescriptions Prior to Admission  Medication Sig Dispense Refill Last Dose  . amphetamine-dextroamphetamine (ADDERALL) 20 MG tablet Take 20 mg by mouth 2 (two) times daily.   11/02/2016 at Unknown time  . sertraline (ZOLOFT) 100 MG tablet Take 100 mg by mouth daily.   Past Week at Unknown time  . amphetamine-dextroamphetamine (ADDERALL) 20 MG tablet Take 1 tablet (20 mg total) by mouth daily. 14 tablet 0     Review of Systems  Constitutional: Negative.  Negative for fatigue and fever.  HENT: Negative.   Respiratory: Negative.  Negative for shortness of breath.   Cardiovascular: Negative.  Negative for chest pain.  Gastrointestinal: Negative.  Negative for abdominal pain, constipation, diarrhea, nausea and vomiting.  Genitourinary: Positive for vaginal discharge and vaginal pain. Negative for dysuria and vaginal bleeding.  Neurological: Negative.  Negative for dizziness and headaches.   Physical Exam   Blood pressure (!) 153/88, pulse (!) 16, temperature 98.1 F (36.7 C), resp. rate (!) 112, weight 167 lb (75.8 kg), last menstrual period 04/02/2016.  Physical Exam  Nursing note and vitals reviewed. Constitutional: She is oriented to person, place, and time. She appears well-developed and well-nourished. No distress.  HENT:  Head: Normocephalic.  Eyes: Pupils are equal, round, and reactive to light.  Cardiovascular: Normal rate, regular rhythm and normal heart sounds.   Respiratory: Effort normal and breath sounds normal. No respiratory distress.  GI: Soft. Bowel sounds are normal. She exhibits no distension. There is no tenderness.  Genitourinary: Vaginal discharge (moderate amount of creamy white discharge in vault) found.  Neurological: She is alert and oriented to person, place, and time.  Skin: Skin is warm and dry.  Psychiatric: She has a normal mood and affect. Her behavior is normal. Judgment and thought content normal.   Fundal height approximately 35cm  Fetal  Tracing:  Baseline: 140 Variability: moderate Accels: 15x15 Decels: none  Toco: occasional uc's  Dilation: Closed Effacement (%): Thick Cervical Position: Posterior Station: Ballotable Exam by:: Ma Hillock CNM  MAU Course  Procedures Results for orders placed or performed during the hospital encounter of 11/02/16 (from the past 24 hour(s))  Protein / creatinine ratio, urine     Status: None   Collection Time: 11/02/16 11:45 AM  Result Value Ref Range   Creatinine, Urine 37.00 mg/dL   Total Protein, Urine <6 mg/dL   Protein Creatinine Ratio        0.00 - 0.15 mg/mg[Cre]  Urinalysis, Routine w reflex microscopic     Status: Abnormal   Collection Time: 11/02/16 11:47 AM  Result Value Ref Range   Color, Urine STRAW (A) YELLOW   APPearance CLEAR CLEAR   Specific Gravity, Urine 1.005 1.005 - 1.030   pH 6.0 5.0 - 8.0   Glucose, UA NEGATIVE NEGATIVE mg/dL   Hgb urine dipstick NEGATIVE NEGATIVE   Bilirubin Urine NEGATIVE NEGATIVE   Ketones, ur NEGATIVE NEGATIVE mg/dL   Protein, ur NEGATIVE NEGATIVE mg/dL   Nitrite NEGATIVE NEGATIVE   Leukocytes, UA TRACE (A) NEGATIVE   RBC / HPF 0-5 0 - 5 RBC/hpf   WBC, UA 0-5 0 - 5 WBC/hpf   Bacteria, UA RARE (A) NONE SEEN   Squamous Epithelial / LPF 0-5 (A) NONE SEEN   Mucus PRESENT   Urine rapid drug screen (hosp performed)     Status: Abnormal   Collection Time: 11/02/16 12:16 PM  Result Value Ref Range   Opiates NONE DETECTED NONE DETECTED   Cocaine NONE DETECTED NONE DETECTED   Benzodiazepines NONE DETECTED NONE DETECTED   Amphetamines POSITIVE (A) NONE DETECTED   Tetrahydrocannabinol NONE DETECTED NONE DETECTED   Barbiturates NONE DETECTED NONE DETECTED  CBC     Status: Abnormal   Collection Time: 11/02/16 12:39 PM  Result Value Ref Range   WBC 9.1 4.0 - 10.5 K/uL   RBC 2.90 (L) 3.87 - 5.11 MIL/uL   Hemoglobin 8.7 (L) 12.0 - 15.0 g/dL   HCT 16.1 (L) 09.6 - 04.5 %   MCV 85.9 78.0 - 100.0 fL   MCH 30.0 26.0 - 34.0 pg   MCHC 34.9  30.0 - 36.0 g/dL   RDW 40.9 81.1 - 91.4 %   Platelets 341 150 - 400 K/uL  Differential     Status: None   Collection Time: 11/02/16 12:39 PM  Result Value Ref Range   Neutrophils Relative % 74 %   Neutro Abs 6.7 1.7 - 7.7 K/uL   Lymphocytes Relative 21 %   Lymphs Abs 1.9 0.7 - 4.0 K/uL   Monocytes Relative 4 %   Monocytes Absolute 0.4 0.1 - 1.0 K/uL   Eosinophils Relative 1 %   Eosinophils Absolute 0.1 0.0 - 0.7 K/uL   Basophils Relative 0 %  Basophils Absolute 0.0 0.0 - 0.1 K/uL  Type and screen [facility]     Status: None   Collection Time: 11/02/16 12:39 PM  Result Value Ref Range   ABO/RH(D) O NEG    Antibody Screen NEG    Sample Expiration 11/05/2016   Comprehensive metabolic panel     Status: Abnormal   Collection Time: 11/02/16 12:39 PM  Result Value Ref Range   Sodium 133 (L) 135 - 145 mmol/L   Potassium 4.1 3.5 - 5.1 mmol/L   Chloride 105 101 - 111 mmol/L   CO2 20 (L) 22 - 32 mmol/L   Glucose, Bld 93 65 - 99 mg/dL   BUN 5 (L) 6 - 20 mg/dL   Creatinine, Ser 1.61 0.44 - 1.00 mg/dL   Calcium 8.5 (L) 8.9 - 10.3 mg/dL   Total Protein 5.9 (L) 6.5 - 8.1 g/dL   Albumin 2.8 (L) 3.5 - 5.0 g/dL   AST 15 15 - 41 U/L   ALT 10 (L) 14 - 54 U/L   Alkaline Phosphatase 223 (H) 38 - 126 U/L   Total Bilirubin 0.3 0.3 - 1.2 mg/dL   GFR calc non Af Amer >60 >60 mL/min   GFR calc Af Amer >60 >60 mL/min   Anion gap 8 5 - 15  ABO/Rh     Status: None   Collection Time: 11/02/16 12:50 PM  Result Value Ref Range   ABO/RH(D) O NEG   Wet prep, genital     Status: Abnormal   Collection Time: 11/02/16  1:46 PM  Result Value Ref Range   Yeast Wet Prep HPF POC NONE SEEN NONE SEEN   Trich, Wet Prep NONE SEEN NONE SEEN   Clue Cells Wet Prep HPF POC PRESENT (A) NONE SEEN   WBC, Wet Prep HPF POC MANY (A) NONE SEEN   Sperm NONE SEEN   Fern Test     Status: None   Collection Time: 11/02/16  2:16 PM  Result Value Ref Range   POCT Fern Test Negative = intact amniotic  membranes   Rh IG workup (includes ABO/Rh)     Status: None (Preliminary result)   Collection Time: 11/02/16  2:43 PM  Result Value Ref Range   Gestational Age(Wks) 35.3    ABO/RH(D) O NEG    Fetal Screen NEG    Unit Number [id number]    Blood Component Type RHIG    Unit division 00    Status of Unit ISSUED    Transfusion Status OK TO TRANSFUSE    MDM UA, UDS Wet prep and gc/chlamydia GBS  Fern- negative Social work consulted US OB Limited- placenta posterior above os, normal AFI, vertex position, EGA 35 3/7 weeks OB Panel CBC, CMP, protein/creat ratio O Neg blood type- Rhogam today  Assessment and Plan   1. Bacterial vaginosis   2. No prenatal care in current pregnancy in third trimester   3. Abdominal pain during pregnancy in third trimester   4. Uncertain dates, antepartum, third trimester   5. [redacted] weeks gestation of pregnancy    -Discharge home in stable condition -Rx for metronidazole and prenatals given to patient -Preterm labor precautions discussed -Patient advised to follow-up with [facility] for prenatal care -Patient may return to [facility]U as needed or if her condition were to change or worsen  Rolm Bookbinder CNM 11/02/2016, 3:32 PM   Allergies as of 11/02/2016      Reactions   Chantix [varenicline]    Altered  mental status      Medication List    STOP taking these medications   amphetamine-dextroamphetamine 20 MG tablet Commonly known as:  ADDERALL     TAKE these medications   metroNIDAZOLE 500 MG tablet Commonly known as:  FLAGYL Take 1 tablet (500 mg total) by mouth 2 (two) times daily.   Prenatal Vitamin 27-0.8 MG Tabs Take 1 tablet by mouth daily.   sertraline 100 MG tablet Commonly known as:  ZOLOFT Take 100 mg by mouth daily.

## 2016-11-02 NOTE — Discharge Instructions (Signed)
Safe Medications in Pregnancy   Acne: Benzoyl Peroxide Salicylic Acid  Backache/Headache: Tylenol: 2 regular strength every 4 hours OR              2 Extra strength every 6 hours  Colds/Coughs/Allergies: Benadryl (alcohol free) 25 mg every 6 hours as needed Breath right strips Claritin Cepacol throat lozenges Chloraseptic throat spray Cold-Eeze- up to three times per day Cough drops, alcohol free Flonase (by prescription only) Guaifenesin Mucinex Robitussin DM (plain only, alcohol free) Saline nasal spray/drops Sudafed (pseudoephedrine) & Actifed ** use only after [redacted] weeks gestation and if you do not have high blood pressure Tylenol Vicks Vaporub Zinc lozenges Zyrtec   Constipation: Colace Ducolax suppositories Fleet enema Glycerin suppositories Metamucil Milk of magnesia Miralax Senokot Smooth move tea  Diarrhea: Kaopectate Imodium A-D  *NO pepto Bismol  Hemorrhoids: Anusol Anusol HC Preparation H Tucks  Indigestion: Tums Maalox Mylanta Zantac  Pepcid  Insomnia: Benadryl (alcohol free) 25mg  every 6 hours as needed Tylenol PM Unisom, no Gelcaps  Leg Cramps: Tums MagGel  Nausea/Vomiting:  Bonine Dramamine Emetrol Ginger extract Sea bands Meclizine  Nausea medication to take during pregnancy:  Unisom (doxylamine succinate 25 mg tablets) Take one tablet daily at bedtime. If symptoms are not adequately controlled, the dose can be increased to a maximum recommended dose of two tablets daily (1/2 tablet in the morning, 1/2 tablet mid-afternoon and one at bedtime). Vitamin B6 100mg  tablets. Take one tablet twice a day (up to 200 mg per day).  Skin Rashes: Aveeno products Benadryl cream or 25mg  every 6 hours as needed Calamine Lotion 1% cortisone cream  Yeast infection: Gyne-lotrimin 7 Monistat 7   **If taking multiple medications, please check labels to avoid duplicating the same active ingredients **take medication as directed on  the label ** Do not exceed 4000 mg of tylenol in 24 hours **Do not take medications that contain aspirin or ibuprofen    Third Trimester of Pregnancy The third trimester is from week 28 through week 40 (months 7 through 9). The third trimester is a time when the unborn baby (fetus) is growing rapidly. At the end of the ninth month, the fetus is about 20 inches in length and weighs 6-10 pounds. Body changes during your third trimester Your body will continue to go through many changes during pregnancy. The changes vary from woman to woman. During the third trimester:  Your weight will continue to increase. You can expect to gain 25-35 pounds (11-16 kg) by the end of the pregnancy.  You may begin to get stretch marks on your hips, abdomen, and breasts.  You may urinate more often because the fetus is moving lower into your pelvis and pressing on your bladder.  You may develop or continue to have heartburn. This is caused by increased hormones that slow down muscles in the digestive tract.  You may develop or continue to have constipation because increased hormones slow digestion and cause the muscles that push waste through your intestines to relax.  You may develop hemorrhoids. These are swollen veins (varicose veins) in the rectum that can itch or be painful.  You may develop swollen, bulging veins (varicose veins) in your legs.  You may have increased body aches in the pelvis, back, or thighs. This is due to weight gain and increased hormones that are relaxing your joints.  You may have changes in your hair. These can include thickening of your hair, rapid growth, and changes in texture. Some women also have hair  loss during or after pregnancy, or hair that feels dry or thin. Your hair will most likely return to normal after your baby is born.  Your breasts will continue to grow and they will continue to become tender. A yellow fluid (colostrum) may leak from your breasts. This is the  first milk you are producing for your baby.  Your belly button may stick out.  You may notice more swelling in your hands, face, or ankles.  You may have increased tingling or numbness in your hands, arms, and legs. The skin on your belly may also feel numb.  You may feel short of breath because of your expanding uterus.  You may have more problems sleeping. This can be caused by the size of your belly, increased need to urinate, and an increase in your body's metabolism.  You may notice the fetus "dropping," or moving lower in your abdomen (lightening).  You may have increased vaginal discharge.  You may notice your joints feel loose and you may have pain around your pelvic bone.  What to expect at prenatal visits You will have prenatal exams every 2 weeks until week 36. Then you will have weekly prenatal exams. During a routine prenatal visit:  You will be weighed to make sure you and the baby are growing normally.  Your blood pressure will be taken.  Your abdomen will be measured to track your baby's growth.  The fetal heartbeat will be listened to.  Any test results from the previous visit will be discussed.  You may have a cervical check near your due date to see if your cervix has softened or thinned (effaced).  You will be tested for Group B streptococcus. This happens between 35 and 37 weeks.  Your health care provider may ask you:  What your birth plan is.  How you are feeling.  If you are feeling the baby move.  If you have had any abnormal symptoms, such as leaking fluid, bleeding, severe headaches, or abdominal cramping.  If you are using any tobacco products, including cigarettes, chewing tobacco, and electronic cigarettes.  If you have any questions.  Other tests or screenings that may be performed during your third trimester include:  Blood tests that check for low iron levels (anemia).  Fetal testing to check the health, activity level, and growth  of the fetus. Testing is done if you have certain medical conditions or if there are problems during the pregnancy.  Nonstress test (NST). This test checks the health of your baby to make sure there are no signs of problems, such as the baby not getting enough oxygen. During this test, a belt is placed around your belly. The baby is made to move, and its heart rate is monitored during movement.  What is false labor? False labor is a condition in which you feel small, irregular tightenings of the muscles in the womb (contractions) that usually go away with rest, changing position, or drinking water. These are called Braxton Hicks contractions. Contractions may last for hours, days, or even weeks before true labor sets in. If contractions come at regular intervals, become more frequent, increase in intensity, or become painful, you should see your health care provider. What are the signs of labor?  Abdominal cramps.  Regular contractions that start at 10 minutes apart and become stronger and more frequent with time.  Contractions that start on the top of the uterus and spread down to the lower abdomen and back.  Increased pelvic pressure  and dull back pain.  A watery or bloody mucus discharge that comes from the vagina.  Leaking of amniotic fluid. This is also known as your "water breaking." It could be a slow trickle or a gush. Let your health care provider know if it has a color or strange odor. If you have any of these signs, call your health care provider right away, even if it is before your due date. Follow these instructions at home: Medicines  Follow your health care provider's instructions regarding medicine use. Specific medicines may be either safe or unsafe to take during pregnancy.  Take a prenatal vitamin that contains at least 600 micrograms (mcg) of folic acid.  If you develop constipation, try taking a stool softener if your health care provider approves. Eating and  drinking  Eat a balanced diet that includes fresh fruits and vegetables, whole grains, good sources of protein such as meat, eggs, or tofu, and low-fat dairy. Your health care provider will help you determine the amount of weight gain that is right for you.  Avoid raw meat and uncooked cheese. These carry germs that can cause birth defects in the baby.  If you have low calcium intake from food, talk to your health care provider about whether you should take a daily calcium supplement.  Eat four or five small meals rather than three large meals a day.  Limit foods that are high in fat and processed sugars, such as fried and sweet foods.  To prevent constipation: ? Drink enough fluid to keep your urine clear or pale yellow. ? Eat foods that are high in fiber, such as fresh fruits and vegetables, whole grains, and beans. Activity  Exercise only as directed by your health care provider. Most women can continue their usual exercise routine during pregnancy. Try to exercise for 30 minutes at least 5 days a week. Stop exercising if you experience uterine contractions.  Avoid heavy lifting.  Do not exercise in extreme heat or humidity, or at high altitudes.  Wear low-heel, comfortable shoes.  Practice good posture.  You may continue to have sex unless your health care provider tells you otherwise. Relieving pain and discomfort  Take frequent breaks and rest with your legs elevated if you have leg cramps or low back pain.  Take warm sitz baths to soothe any pain or discomfort caused by hemorrhoids. Use hemorrhoid cream if your health care provider approves.  Wear a good support bra to prevent discomfort from breast tenderness.  If you develop varicose veins: ? Wear support pantyhose or compression stockings as told by your healthcare provider. ? Elevate your feet for 15 minutes, 3-4 times a day. Prenatal care  Write down your questions. Take them to your prenatal visits.  Keep all  your prenatal visits as told by your health care provider. This is important. Safety  Wear your seat belt at all times when driving.  Make a list of emergency phone numbers, including numbers for family, friends, the hospital, and police and fire departments. General instructions  Avoid cat litter boxes and soil used by cats. These carry germs that can cause birth defects in the baby. If you have a cat, ask someone to clean the litter box for you.  Do not travel far distances unless it is absolutely necessary and only with the approval of your health care provider.  Do not use hot tubs, steam rooms, or saunas.  Do not drink alcohol.  Do not use any products that contain nicotine  or tobacco, such as cigarettes and e-cigarettes. If you need help quitting, ask your health care provider.  Do not use any medicinal herbs or unprescribed drugs. These chemicals affect the formation and growth of the baby.  Do not douche or use tampons or scented sanitary pads.  Do not cross your legs for long periods of time.  To prepare for the arrival of your baby: ? Take prenatal classes to understand, practice, and ask questions about labor and delivery. ? Make a trial run to the hospital. ? Visit the hospital and tour the maternity area. ? Arrange for maternity or paternity leave through employers. ? Arrange for family and friends to take care of pets while you are in the hospital. ? Purchase a rear-facing car seat and make sure you know how to install it in your car. ? Pack your hospital bag. ? Prepare the babys nursery. Make sure to remove all pillows and stuffed animals from the baby's crib to prevent suffocation.  Visit your dentist if you have not gone during your pregnancy. Use a soft toothbrush to brush your teeth and be gentle when you floss. Contact a health care provider if:  You are unsure if you are in labor or if your water has broken.  You become dizzy.  You have mild pelvic  cramps, pelvic pressure, or nagging pain in your abdominal area.  You have lower back pain.  You have persistent nausea, vomiting, or diarrhea.  You have an unusual or bad smelling vaginal discharge.  You have pain when you urinate. Get help right away if:  Your water breaks before 37 weeks.  You have regular contractions less than 5 minutes apart before 37 weeks.  You have a fever.  You are leaking fluid from your vagina.  You have spotting or bleeding from your vagina.  You have severe abdominal pain or cramping.  You have rapid weight loss or weight gain.  You have shortness of breath with chest pain.  You notice sudden or extreme swelling of your face, hands, ankles, feet, or legs.  Your baby makes fewer than 10 movements in 2 hours.  You have severe headaches that do not go away when you take medicine.  You have vision changes. Summary  The third trimester is from week 28 through week 40, months 7 through 9. The third trimester is a time when the unborn baby (fetus) is growing rapidly.  During the third trimester, your discomfort may increase as you and your baby continue to gain weight. You may have abdominal, leg, and back pain, sleeping problems, and an increased need to urinate.  During the third trimester your breasts will keep growing and they will continue to become tender. A yellow fluid (colostrum) may leak from your breasts. This is the first milk you are producing for your baby.  False labor is a condition in which you feel small, irregular tightenings of the muscles in the womb (contractions) that eventually go away. These are called Braxton Hicks contractions. Contractions may last for hours, days, or even weeks before true labor sets in.  Signs of labor can include: abdominal cramps; regular contractions that start at 10 minutes apart and become stronger and more frequent with time; watery or bloody mucus discharge that comes from the vagina; increased  pelvic pressure and dull back pain; and leaking of amniotic fluid. This information is not intended to replace advice given to you by your health care provider. Make sure you discuss any questions you  have with your health care provider. Document Released: 12/13/2000 Document Revised: 05/27/2015 Document Reviewed: 02/20/2012 Elsevier Interactive Patient Education  2017 [employer]

## 2016-11-02 NOTE — MAU Note (Signed)
Pt presents to [facility]U with complaints vaginal pressure, Pt states she has had irregular periods noting her last period to be July. Denies any complications with previous 2 pregnancies.
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
## 2016-11-03 LAB — GC/CHLAMYDIA PROBE AMP (~~LOC~~) NOT AT ARMC
Chlamydia: NEGATIVE
NEISSERIA GONORRHEA: NEGATIVE

## 2016-11-03 LAB — RH IG WORKUP (INCLUDES ABO/RH)
ABO/RH(D): O NEG
Fetal Screen: NEGATIVE
Gestational Age(Wks): 35.3
Unit division: 0

## 2016-11-03 LAB — RUBELLA SCREEN: Rubella: 1.35 index (ref >0.99)

## 2016-11-03 LAB — RPR: RPR: NONREACTIVE

## 2016-11-03 LAB — HIV ANTIBODY (ROUTINE TESTING W REFLEX): HIV Screen 4th Generation wRfx: NONREACTIVE

## 2016-11-04 LAB — CULTURE, BETA STREP (GROUP B ONLY)

## 2016-11-07 LAB — HEPATITIS B SURFACE ANTIGEN: Hepatitis B Surface Ag: NEGATIVE

## 2016-11-08 ENCOUNTER — Encounter: Payer: Self-pay | Admitting: Nurse Practitioner

## 2016-11-08 ENCOUNTER — Other Ambulatory Visit (HOSPITAL_COMMUNITY)
Admission: RE | Admit: 2016-11-08 | Discharge: 2016-11-08 | Disposition: A | Discharge status: Home / Self Care | Payer: Medicaid Other | Source: Ambulatory Visit | Attending: Nurse Practitioner | Admitting: Nurse Practitioner

## 2016-11-08 ENCOUNTER — Ambulatory Visit (INDEPENDENT_AMBULATORY_CARE_PROVIDER_SITE_OTHER): Payer: Medicaid Other | Admitting: Nurse Practitioner

## 2016-11-08 ENCOUNTER — Encounter: Payer: Self-pay | Admitting: *Deleted

## 2016-11-08 VITALS — BP 117/77 | HR 82 | Wt 168.0 lb

## 2016-11-08 DIAGNOSIS — O0990 Supervision of high risk pregnancy, unspecified, unspecified trimester: Secondary | ICD-10-CM

## 2016-11-08 DIAGNOSIS — Z720 Tobacco use: Secondary | ICD-10-CM

## 2016-11-08 DIAGNOSIS — Z3A36 36 weeks gestation of pregnancy: Secondary | ICD-10-CM | POA: Insufficient documentation

## 2016-11-08 DIAGNOSIS — O0933 Supervision of pregnancy with insufficient antenatal care, third trimester: Secondary | ICD-10-CM

## 2016-11-08 DIAGNOSIS — O0993 Supervision of high risk pregnancy, unspecified, third trimester: Secondary | ICD-10-CM

## 2016-11-08 DIAGNOSIS — K029 Dental caries, unspecified: Secondary | ICD-10-CM | POA: Diagnosis not present

## 2016-11-08 DIAGNOSIS — D649 Anemia, unspecified: Secondary | ICD-10-CM | POA: Diagnosis not present

## 2016-11-08 DIAGNOSIS — O99013 Anemia complicating pregnancy, third trimester: Secondary | ICD-10-CM | POA: Diagnosis not present

## 2016-11-08 DIAGNOSIS — O360131 Maternal care for anti-D [Rh] antibodies, third trimester, fetus 1: Secondary | ICD-10-CM

## 2016-11-08 DIAGNOSIS — O0930 Supervision of pregnancy with insufficient antenatal care, unspecified trimester: Secondary | ICD-10-CM

## 2016-11-08 LAB — OB RESULTS CONSOLE GC/CHLAMYDIA: GC PROBE AMP, GENITAL: NEGATIVE

## 2016-11-08 NOTE — Progress Notes (Signed)
Stopped Zoloft and Adderall last week
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
## 2016-11-08 NOTE — Patient Instructions (Addendum)
Return in one week   Constipation, Adult Constipation is when a person:  Poops (has a bowel movement) fewer times in a week than normal.  Has a hard time pooping.  Has poop that is dry, hard, or bigger than normal.  Follow these instructions at home: Eating and drinking   Eat foods that have a lot of fiber, such as: ? Fresh fruits and vegetables. ? Whole grains. ? Beans.  Eat less of foods that are high in fat, low in fiber, or overly processed, such as: ? [place] fries. ? Hamburgers. ? Cookies. ? Candy. ? Soda.  Drink enough fluid to keep your pee (urine) clear or pale yellow. General instructions  Exercise regularly or as told by your doctor.  Go to the restroom when you feel like you need to poop. Do not hold it in.  Take over-the-counter and prescription medicines only as told by your doctor. These include any fiber supplements.  Do pelvic floor retraining exercises, such as: ? Doing deep breathing while relaxing your lower belly (abdomen). ? Relaxing your pelvic floor while pooping.  Watch your condition for any changes.  Keep all follow-up visits as told by your doctor. This is important. Contact a doctor if:  You have pain that gets worse.  You have a fever.  You have not pooped for 4 days.  You throw up (vomit).  You are not hungry.  You lose weight.  You are bleeding from the anus.  You have thin, pencil-like poop (stool). Get help right away if:  You have a fever, and your symptoms suddenly get worse.  You leak poop or have blood in your poop.  Your belly feels hard or bigger than normal (is bloated).  You have very bad belly pain.  You feel dizzy or you faint. This information is not intended to replace advice given to you by your health care provider. Make sure you discuss any questions you have with your health care provider. Document Released: 06/07/2007 Document Revised: 07/09/2015 Document Reviewed: 06/09/2015 Elsevier  Interactive Patient Education  2017 [employer]

## 2016-11-09 ENCOUNTER — Telehealth: Payer: Self-pay | Admitting: *Deleted

## 2016-11-09 LAB — CERVICOVAGINAL ANCILLARY ONLY
CHLAMYDIA, DNA PROBE: NEGATIVE
NEISSERIA GONORRHEA: NEGATIVE

## 2016-11-09 LAB — PROTEIN / CREATININE RATIO, URINE
Creatinine, Urine: 112.3 mg/dL
PROTEIN UR: 40.6 mg/dL
PROTEIN/CREAT RATIO: 362 mg/g{creat} — AB (ref 0–200)

## 2016-11-09 NOTE — Telephone Encounter (Signed)
LM for pt to rtn call - Needs to take Iron in addition to routine prenatal vitamins

## 2016-11-10 ENCOUNTER — Other Ambulatory Visit: Payer: Self-pay | Admitting: *Deleted

## 2016-11-10 ENCOUNTER — Telehealth: Payer: Self-pay | Admitting: *Deleted

## 2016-11-10 DIAGNOSIS — O0990 Supervision of high risk pregnancy, unspecified, unspecified trimester: Secondary | ICD-10-CM

## 2016-11-10 LAB — CMP AND LIVER
ALBUMIN: 3.3 g/dL — AB (ref 3.5–5.5)
ALK PHOS: 239 IU/L — AB (ref 39–117)
ALT: 9 IU/L (ref 0–32)
AST: 12 IU/L (ref 0–40)
BUN: 7 mg/dL (ref 6–20)
Bilirubin Total: <0.2 mg/dL (ref 0.0–1.2)
Bilirubin, Direct: 0.05 mg/dL (ref 0.00–0.40)
CO2: 19 mmol/L — ABNORMAL LOW (ref 20–29)
CREATININE: 0.5 mg/dL — AB (ref 0.57–1.00)
Calcium: 9 mg/dL (ref 8.7–10.2)
Chloride: 104 mmol/L (ref 96–106)
GFR calc Af Amer: 147 mL/min/{1.73_m2} (ref >59)
GFR calc non Af Amer: 128 mL/min/{1.73_m2} (ref >59)
Glucose: 85 mg/dL (ref 65–99)
POTASSIUM: 4 mmol/L (ref 3.5–5.2)
Sodium: 136 mmol/L (ref 134–144)
Total Protein: 5.5 g/dL — ABNORMAL LOW (ref 6.0–8.5)

## 2016-11-10 LAB — OBSTETRIC PANEL, INCLUDING HIV
BASOS ABS: 0 10*3/uL (ref 0.0–0.2)
Basos: 0 %
EOS (ABSOLUTE): 0.2 10*3/uL (ref 0.0–0.4)
EOS: 2 %
HEP B S AG: NEGATIVE
HIV Screen 4th Generation wRfx: NONREACTIVE
Hematocrit: 27.3 % — ABNORMAL LOW (ref 34.0–46.6)
Hemoglobin: 9.1 g/dL — ABNORMAL LOW (ref 11.1–15.9)
IMMATURE GRANULOCYTES: 1 %
Immature Grans (Abs): 0.1 10*3/uL (ref 0.0–0.1)
LYMPHS ABS: 2.5 10*3/uL (ref 0.7–3.1)
Lymphs: 23 %
MCH: 28 pg (ref 26.6–33.0)
MCHC: 33.3 g/dL (ref 31.5–35.7)
MCV: 84 fL (ref 79–97)
Monocytes Absolute: 0.8 10*3/uL (ref 0.1–0.9)
Monocytes: 7 %
NEUTROS ABS: 7.3 10*3/uL — AB (ref 1.4–7.0)
Neutrophils: 67 %
PLATELETS: 337 10*3/uL (ref 150–379)
RBC: 3.25 x10E6/uL — AB (ref 3.77–5.28)
RDW: 14.1 % (ref 12.3–15.4)
RPR: NONREACTIVE
Rh Factor: NEGATIVE
Rubella Antibodies, IGG: 1.32 index (ref >0.99)
WBC: 10.9 10*3/uL — AB (ref 3.4–10.8)

## 2016-11-10 LAB — AB SCR+ANTIBODY ID: Antibody Screen: POSITIVE — AB

## 2016-11-10 LAB — STREP GP B NAA: Strep Gp B NAA: NEGATIVE

## 2016-11-10 LAB — HEMOGLOBIN A1C
Est. average glucose Bld gHb Est-mCnc: 108 mg/dL
HEMOGLOBIN A1C: 5.4 % (ref 4.8–5.6)

## 2016-11-10 LAB — URINE CULTURE, OB REFLEX: ORGANISM ID, BACTERIA: NO GROWTH

## 2016-11-10 LAB — CULTURE, OB URINE

## 2016-11-10 NOTE — Telephone Encounter (Signed)
Add on lab per Debbe Mountserri Burlson, NP

## 2016-11-13 DIAGNOSIS — O360131 Maternal care for anti-D [Rh] antibodies, third trimester, fetus 1: Secondary | ICD-10-CM | POA: Insufficient documentation

## 2016-11-13 LAB — FERRITIN: Ferritin: 6 ng/mL — ABNORMAL LOW (ref 15–150)

## 2016-11-13 LAB — IRON AND TIBC
IRON SATURATION: 4 % — AB (ref 15–55)
Iron: 21 ug/dL — ABNORMAL LOW (ref 27–159)
TIBC: 577 ug/dL — AB (ref 250–450)
UIBC: 556 ug/dL — ABNORMAL HIGH (ref 131–425)

## 2016-11-13 LAB — SPECIMEN STATUS REPORT

## 2016-11-13 LAB — VITAMIN B12: VITAMIN B 12: 262 pg/mL (ref 232–1245)

## 2016-11-13 NOTE — Progress Notes (Signed)
Not enough urine collected to complete this order.

## 2016-11-14 ENCOUNTER — Encounter (INDEPENDENT_AMBULATORY_CARE_PROVIDER_SITE_OTHER): Payer: Medicaid Other | Admitting: *Deleted

## 2016-11-14 ENCOUNTER — Encounter: Payer: Self-pay | Admitting: *Deleted

## 2016-11-14 ENCOUNTER — Ambulatory Visit (INDEPENDENT_AMBULATORY_CARE_PROVIDER_SITE_OTHER): Payer: Medicaid Other | Admitting: Obstetrics and Gynecology

## 2016-11-14 ENCOUNTER — Encounter: Payer: Self-pay | Admitting: Obstetrics and Gynecology

## 2016-11-14 VITALS — BP 121/85 | HR 72 | Wt 165.8 lb

## 2016-11-14 DIAGNOSIS — Z23 Encounter for immunization: Secondary | ICD-10-CM

## 2016-11-14 DIAGNOSIS — O0993 Supervision of high risk pregnancy, unspecified, third trimester: Secondary | ICD-10-CM

## 2016-11-14 DIAGNOSIS — Z6791 Unspecified blood type, Rh negative: Secondary | ICD-10-CM | POA: Insufficient documentation

## 2016-11-14 DIAGNOSIS — O0990 Supervision of high risk pregnancy, unspecified, unspecified trimester: Secondary | ICD-10-CM

## 2016-11-14 DIAGNOSIS — O26899 Other specified pregnancy related conditions, unspecified trimester: Secondary | ICD-10-CM

## 2016-11-14 LAB — TOXASSURE SELECT 13 (MW), URINE
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
## 2016-11-16 ENCOUNTER — Inpatient Hospital Stay (HOSPITAL_COMMUNITY)
Admission: AD | Admit: 2016-11-16 | Discharge: 2016-11-16 | Disposition: A | Discharge status: Home / Self Care | Payer: Medicaid Other | Source: Ambulatory Visit | Attending: Obstetrics & Gynecology | Admitting: Obstetrics & Gynecology

## 2016-11-16 ENCOUNTER — Other Ambulatory Visit: Payer: Self-pay

## 2016-11-16 DIAGNOSIS — O26899 Other specified pregnancy related conditions, unspecified trimester: Secondary | ICD-10-CM

## 2016-11-16 DIAGNOSIS — Z6791 Unspecified blood type, Rh negative: Secondary | ICD-10-CM

## 2016-11-16 DIAGNOSIS — O0990 Supervision of high risk pregnancy, unspecified, unspecified trimester: Secondary | ICD-10-CM

## 2016-11-16 DIAGNOSIS — O471 False labor at or after 37 completed weeks of gestation: Secondary | ICD-10-CM

## 2016-11-16 DIAGNOSIS — Z3493 Encounter for supervision of normal pregnancy, unspecified, third trimester: Secondary | ICD-10-CM | POA: Diagnosis present

## 2016-11-16 NOTE — MAU Note (Signed)
Pt.  Started contracting around 3:00 p.m. Yesterday with pelvic pressure and shooting pain.  Discomfort in lower abd.  Pain 6/10 with contractions.  Positive for fetal movement, denies vaginal bleeding, denies sudden gush of fluid. EFM applied - 150s, toco applied - abd. Soft.

## 2016-11-16 NOTE — Discharge Instructions (Signed)

## 2016-11-16 NOTE — MAU Note (Signed)
Fetal tracing, occasionally recording maternal heart rate due to pt. Position (Hi-fowlers with legs crossed).  Upon entering the room pt. Talking with friend (present) and texting on cell. phone.

## 2016-11-16 NOTE — MAU Note (Signed)
Pt reports she is having a lot of pressure , ? Contractions. Denies bleeding or ROM

## 2016-11-16 NOTE — MAU Note (Signed)
I have communicated with Dr. Verta EllenMinott and reviewed vital signs:  Vitals:   11/16/16 0945 11/16/16 1203  BP: 128/83 128/79  Pulse: (!) 119 99  Resp: 18   Temp: 98.3 F (36.8 C)   SpO2: 100%     Vaginal exam:  Dilation: 1(EOS 1 cm; Internal OS closed (no change)) Effacement (%): Thick,   Also reviewed contraction pattern and that non-stress test is reactive.  It has been documented that patient is contracting irregularly with no cervical change over 1.5 hours not indicating active labor.  Patient denies any other complaints.  Based on this report provider has given order for discharge.  A discharge order and diagnosis entered by a provider.   Labor discharge instructions reviewed with patient.

## 2016-11-22 ENCOUNTER — Encounter: Payer: Self-pay | Admitting: Obstetrics and Gynecology

## 2016-11-27 ENCOUNTER — Ambulatory Visit (INDEPENDENT_AMBULATORY_CARE_PROVIDER_SITE_OTHER): Payer: Medicaid Other | Admitting: Obstetrics and Gynecology

## 2016-11-27 VITALS — BP 108/74 | HR 102 | Wt 168.0 lb

## 2016-11-27 DIAGNOSIS — O99013 Anemia complicating pregnancy, third trimester: Secondary | ICD-10-CM | POA: Diagnosis not present

## 2016-11-27 DIAGNOSIS — O0993 Supervision of high risk pregnancy, unspecified, third trimester: Secondary | ICD-10-CM | POA: Diagnosis not present

## 2016-11-27 DIAGNOSIS — O0990 Supervision of high risk pregnancy, unspecified, unspecified trimester: Secondary | ICD-10-CM

## 2016-11-27 DIAGNOSIS — F419 Anxiety disorder, unspecified: Secondary | ICD-10-CM

## 2016-11-27 DIAGNOSIS — D649 Anemia, unspecified: Secondary | ICD-10-CM | POA: Diagnosis not present

## 2016-11-27 DIAGNOSIS — Z6791 Unspecified blood type, Rh negative: Secondary | ICD-10-CM

## 2016-11-27 DIAGNOSIS — O26899 Other specified pregnancy related conditions, unspecified trimester: Secondary | ICD-10-CM

## 2016-11-27 DIAGNOSIS — F329 Major depressive disorder, single episode, unspecified: Secondary | ICD-10-CM | POA: Diagnosis not present
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
## 2016-11-28 ENCOUNTER — Encounter (HOSPITAL_COMMUNITY): Payer: Self-pay | Admitting: *Deleted

## 2016-11-28 ENCOUNTER — Inpatient Hospital Stay (HOSPITAL_COMMUNITY)
Admission: AD | Admit: 2016-11-28 | Discharge: 2016-11-30 | DRG: 807 | Disposition: A | Discharge status: Home / Self Care | Payer: Medicaid Other | Source: Ambulatory Visit | Attending: Family Medicine | Admitting: Family Medicine

## 2016-11-28 ENCOUNTER — Other Ambulatory Visit: Payer: Self-pay

## 2016-11-28 DIAGNOSIS — O139 Gestational [pregnancy-induced] hypertension without significant proteinuria, unspecified trimester: Secondary | ICD-10-CM

## 2016-11-28 DIAGNOSIS — O9902 Anemia complicating childbirth: Secondary | ICD-10-CM | POA: Diagnosis present

## 2016-11-28 DIAGNOSIS — Z3A39 39 weeks gestation of pregnancy: Secondary | ICD-10-CM | POA: Diagnosis not present

## 2016-11-28 DIAGNOSIS — Z23 Encounter for immunization: Secondary | ICD-10-CM

## 2016-11-28 DIAGNOSIS — Z6791 Unspecified blood type, Rh negative: Secondary | ICD-10-CM | POA: Diagnosis not present

## 2016-11-28 DIAGNOSIS — O26899 Other specified pregnancy related conditions, unspecified trimester: Secondary | ICD-10-CM

## 2016-11-28 DIAGNOSIS — O0930 Supervision of pregnancy with insufficient antenatal care, unspecified trimester: Secondary | ICD-10-CM

## 2016-11-28 DIAGNOSIS — D649 Anemia, unspecified: Secondary | ICD-10-CM | POA: Diagnosis present

## 2016-11-28 DIAGNOSIS — O134 Gestational [pregnancy-induced] hypertension without significant proteinuria, complicating childbirth: Secondary | ICD-10-CM | POA: Diagnosis present

## 2016-11-28 DIAGNOSIS — O99334 Smoking (tobacco) complicating childbirth: Secondary | ICD-10-CM | POA: Diagnosis present

## 2016-11-28 DIAGNOSIS — O360131 Maternal care for anti-D [Rh] antibodies, third trimester, fetus 1: Secondary | ICD-10-CM | POA: Diagnosis present

## 2016-11-28 DIAGNOSIS — F17210 Nicotine dependence, cigarettes, uncomplicated: Secondary | ICD-10-CM | POA: Diagnosis present

## 2016-11-28 DIAGNOSIS — O26893 Other specified pregnancy related conditions, third trimester: Secondary | ICD-10-CM | POA: Diagnosis present

## 2016-11-28 DIAGNOSIS — O0990 Supervision of high risk pregnancy, unspecified, unspecified trimester: Secondary | ICD-10-CM

## 2016-11-28 HISTORY — DX: Anxiety disorder, unspecified: F41.9

## 2016-11-28 HISTORY — DX: Major depressive disorder, single episode, unspecified: F32.9

## 2016-11-28 HISTORY — DX: Depression, unspecified: F32.A

## 2016-11-28 HISTORY — DX: Gastro-esophageal reflux disease without esophagitis: K21.9

## 2016-11-28 HISTORY — DX: Anemia, unspecified: D64.9

## 2016-11-28 LAB — PROTEIN / CREATININE RATIO, URINE: Creatinine, Urine: 29 mg/dL

## 2016-11-28 LAB — COMPREHENSIVE METABOLIC PANEL
ALT: 24 U/L (ref 14–54)
ANION GAP: 11 (ref 5–15)
AST: 27 U/L (ref 15–41)
Albumin: 2.7 g/dL — ABNORMAL LOW (ref 3.5–5.0)
Alkaline Phosphatase: 343 U/L — ABNORMAL HIGH (ref 38–126)
BUN: 7 mg/dL (ref 6–20)
CHLORIDE: 105 mmol/L (ref 101–111)
CO2: 18 mmol/L — AB (ref 22–32)
Calcium: 8.9 mg/dL (ref 8.9–10.3)
Creatinine, Ser: 0.5 mg/dL (ref 0.44–1.00)
Glucose, Bld: 83 mg/dL (ref 65–99)
POTASSIUM: 3.7 mmol/L (ref 3.5–5.1)
SODIUM: 134 mmol/L — AB (ref 135–145)
Total Bilirubin: 0.4 mg/dL (ref 0.3–1.2)
Total Protein: 6.1 g/dL — ABNORMAL LOW (ref 6.5–8.1)

## 2016-11-28 LAB — CBC
HCT: 28.2 % — ABNORMAL LOW (ref 36.0–46.0)
Hemoglobin: 9.3 g/dL — ABNORMAL LOW (ref 12.0–15.0)
MCH: 27.4 pg (ref 26.0–34.0)
MCHC: 33 g/dL (ref 30.0–36.0)
MCV: 83.2 fL (ref 78.0–100.0)
Platelets: 292 10*3/uL (ref 150–400)
RBC: 3.39 MIL/uL — ABNORMAL LOW (ref 3.87–5.11)
RDW: 14.8 % (ref 11.5–15.5)
WBC: 8.9 10*3/uL (ref 4.0–10.5)

## 2016-11-28 MED ORDER — OXYCODONE-ACETAMINOPHEN 5-325 MG PO TABS: 2.0000 | ORAL_TABLET | ORAL | Status: DC | PRN

## 2016-11-28 MED ORDER — FLEET ENEMA 7-19 GM/118ML RE ENEM: 1.0000 | ENEMA | RECTAL | Status: DC | PRN

## 2016-11-28 MED ORDER — NICOTINE 21 MG/24HR TD PT24
21.0000 mg | MEDICATED_PATCH | Freq: Every day | TRANSDERMAL | Status: DC
  Administered 2016-11-28: 21 mg via TRANSDERMAL
  Filled 2016-11-28 (×2): qty 1

## 2016-11-28 MED ORDER — NICOTINE 7 MG/24HR TD PT24: 7.0000 mg | MEDICATED_PATCH | Freq: Every day | TRANSDERMAL | Status: DC

## 2016-11-28 MED ORDER — ACETAMINOPHEN 325 MG PO TABS: 650.0000 mg | ORAL_TABLET | ORAL | Status: DC | PRN

## 2016-11-28 MED ORDER — LACTATED RINGERS IV SOLN: 500.0000 mL | INTRAVENOUS | Status: DC | PRN

## 2016-11-28 MED ORDER — LIDOCAINE HCL (PF) 1 % IJ SOLN
30.0000 mL | INTRAMUSCULAR | Status: DC | PRN
  Filled 2016-11-28: qty 30

## 2016-11-28 MED ORDER — FENTANYL CITRATE (PF) 100 MCG/2ML IJ SOLN
100.0000 ug | INTRAMUSCULAR | Status: DC | PRN
  Administered 2016-11-28 – 2016-11-29 (×3): 100 ug via INTRAVENOUS
  Filled 2016-11-28 (×3): qty 2

## 2016-11-28 MED ORDER — OXYCODONE-ACETAMINOPHEN 5-325 MG PO TABS: 1.0000 | ORAL_TABLET | ORAL | Status: DC | PRN

## 2016-11-28 MED ORDER — ONDANSETRON HCL 4 MG/2ML IJ SOLN: 4.0000 mg | Freq: Four times a day (QID) | INTRAMUSCULAR | Status: DC | PRN

## 2016-11-28 MED ORDER — OXYTOCIN BOLUS FROM INFUSION: 500.0000 mL | Freq: Once | INTRAVENOUS | Status: DC

## 2016-11-28 MED ORDER — NICOTINE 14 MG/24HR TD PT24
14.0000 mg | MEDICATED_PATCH | Freq: Every day | TRANSDERMAL | Status: DC
  Filled 2016-11-28 (×2): qty 1

## 2016-11-28 MED ORDER — LACTATED RINGERS IV SOLN
INTRAVENOUS | Status: DC
  Administered 2016-11-28 (×3): via INTRAVENOUS

## 2016-11-28 MED ORDER — SOD CITRATE-CITRIC ACID 500-334 MG/5ML PO SOLN: 30.0000 mL | ORAL | Status: DC | PRN

## 2016-11-28 MED ORDER — OXYTOCIN 40 UNITS IN LACTATED RINGERS INFUSION - SIMPLE MED
2.5000 [IU]/h | INTRAVENOUS | Status: DC
  Filled 2016-11-28: qty 1000

## 2016-11-28 MED ORDER — TERBUTALINE SULFATE 1 MG/ML IJ SOLN: 0.2500 mg | Freq: Once | INTRAMUSCULAR | Status: DC | PRN

## 2016-11-28 MED ORDER — MISOPROSTOL 25 MCG QUARTER TABLET
25.0000 ug | ORAL_TABLET | ORAL | Status: DC | PRN
  Administered 2016-11-28 (×2): 25 ug via VAGINAL
  Filled 2016-11-28 (×3): qty 1

## 2016-11-28 NOTE — MAU Note (Signed)
+  contractions 4-5 minutes apart  Denies LOF  +bloddy show patient reports  +FM  Was 1cm/thick in office yesterday
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
## 2016-11-29 ENCOUNTER — Telehealth: Payer: Self-pay | Admitting: Radiology

## 2016-11-29 ENCOUNTER — Encounter (HOSPITAL_COMMUNITY): Payer: Self-pay

## 2016-11-29 DIAGNOSIS — O134 Gestational [pregnancy-induced] hypertension without significant proteinuria, complicating childbirth: Secondary | ICD-10-CM

## 2016-11-29 DIAGNOSIS — Z3A39 39 weeks gestation of pregnancy: Secondary | ICD-10-CM

## 2016-11-29 DIAGNOSIS — O139 Gestational [pregnancy-induced] hypertension without significant proteinuria, unspecified trimester: Secondary | ICD-10-CM

## 2016-11-29 LAB — RPR: RPR: NONREACTIVE

## 2016-11-29 MED ORDER — DIBUCAINE 1 % RE OINT
1.0000 | TOPICAL_OINTMENT | RECTAL | Status: DC | PRN
Start: 2016-11-29 — End: 2016-11-30

## 2016-11-29 MED ORDER — ONDANSETRON HCL 4 MG/2ML IJ SOLN: 4.0000 mg | INTRAMUSCULAR | Status: DC | PRN

## 2016-11-29 MED ORDER — PHENYLEPHRINE 40 MCG/ML (10ML) SYRINGE FOR IV PUSH (FOR BLOOD PRESSURE SUPPORT)
80.0000 ug | PREFILLED_SYRINGE | INTRAVENOUS | Status: DC | PRN
  Filled 2016-11-29: qty 5

## 2016-11-29 MED ORDER — COCONUT OIL OIL: 1.0000 "application " | TOPICAL_OIL | Status: DC | PRN

## 2016-11-29 MED ORDER — DIPHENHYDRAMINE HCL 25 MG PO CAPS: 25.0000 mg | ORAL_CAPSULE | Freq: Four times a day (QID) | ORAL | Status: DC | PRN

## 2016-11-29 MED ORDER — ZOLPIDEM TARTRATE 5 MG PO TABS
5.0000 mg | ORAL_TABLET | Freq: Every evening | ORAL | Status: DC | PRN
  Administered 2016-11-29: 5 mg via ORAL
  Filled 2016-11-29: qty 1

## 2016-11-29 MED ORDER — TETANUS-DIPHTH-ACELL PERTUSSIS 5-2.5-18.5 LF-MCG/0.5 IM SUSP: 0.5000 mL | Freq: Once | INTRAMUSCULAR | Status: DC

## 2016-11-29 MED ORDER — FENTANYL 2.5 MCG/ML BUPIVACAINE 1/10 % EPIDURAL INFUSION (WH - ANES): 14.0000 mL/h | INTRAMUSCULAR | Status: DC | PRN

## 2016-11-29 MED ORDER — ONDANSETRON HCL 4 MG PO TABS: 4.0000 mg | ORAL_TABLET | ORAL | Status: DC | PRN

## 2016-11-29 MED ORDER — ACETAMINOPHEN 325 MG PO TABS
650.0000 mg | ORAL_TABLET | ORAL | Status: DC | PRN
Start: 2016-11-29 — End: 2016-11-30
  Administered 2016-11-29 (×4): 650 mg via ORAL
  Filled 2016-11-29 (×4): qty 2

## 2016-11-29 MED ORDER — LACTATED RINGERS IV SOLN: 500.0000 mL | Freq: Once | INTRAVENOUS | Status: DC

## 2016-11-29 MED ORDER — EPHEDRINE 5 MG/ML INJ
10.0000 mg | INTRAVENOUS | Status: DC | PRN
  Filled 2016-11-29: qty 2

## 2016-11-29 MED ORDER — DIPHENHYDRAMINE HCL 50 MG/ML IJ SOLN: 12.5000 mg | INTRAMUSCULAR | Status: DC | PRN

## 2016-11-29 MED ORDER — INFLUENZA VAC SPLIT QUAD 0.5 ML IM SUSY: 0.5000 mL | PREFILLED_SYRINGE | INTRAMUSCULAR | Status: DC | PRN

## 2016-11-29 MED ORDER — SIMETHICONE 80 MG PO CHEW
80.0000 mg | CHEWABLE_TABLET | ORAL | Status: DC | PRN
  Administered 2016-11-29: 80 mg via ORAL
  Filled 2016-11-29: qty 1

## 2016-11-29 MED ORDER — WITCH HAZEL-GLYCERIN EX PADS: 1.0000 "application " | MEDICATED_PAD | CUTANEOUS | Status: DC | PRN

## 2016-11-29 MED ORDER — SENNOSIDES-DOCUSATE SODIUM 8.6-50 MG PO TABS
2.0000 | ORAL_TABLET | ORAL | Status: DC
  Administered 2016-11-29: 2 via ORAL
  Filled 2016-11-29: qty 2

## 2016-11-29 MED ORDER — OXYTOCIN 10 UNIT/ML IJ SOLN
INTRAMUSCULAR | Status: AC
  Administered 2016-11-29: 10 [IU]
  Filled 2016-11-29: qty 1

## 2016-11-29 MED ORDER — IBUPROFEN 600 MG PO TABS
600.0000 mg | ORAL_TABLET | Freq: Four times a day (QID) | ORAL | Status: DC
  Administered 2016-11-29 – 2016-11-30 (×6): 600 mg via ORAL
  Filled 2016-11-29 (×6): qty 1

## 2016-11-29 MED ORDER — PRENATAL MULTIVITAMIN CH
1.0000 | ORAL_TABLET | Freq: Every day | ORAL | Status: DC
  Administered 2016-11-29 – 2016-11-30 (×2): 1 via ORAL
  Filled 2016-11-29 (×2): qty 1

## 2016-11-29 MED ORDER — BENZOCAINE-MENTHOL 20-0.5 % EX AERO
1.0000 "application " | INHALATION_SPRAY | CUTANEOUS | Status: DC | PRN
  Administered 2016-11-29: 1 via TOPICAL
  Filled 2016-11-29: qty 56

## 2016-11-29 NOTE — Telephone Encounter (Signed)
Left message on cell phone voicemail of postpartum appointment scheduled 01/04/16 @ 1:20. Call back to [facility] if need to reschedule

## 2016-11-29 NOTE — Progress Notes (Addendum)
CSW met with birth mother to offer support regarding plan for adoption and to see if she had any questions, concerns or needs at this time.  She states she has concerns about her experience in the hospital, but mainly is thankful that her baby is here and is with her adoptive parents.  CSW provided space for birth mother to talk about her experience and apologized that she felt she was not well heard and supported.  She states she would have liked for her baby's adoptive parents to have been allowed to stay in her room overnight to care for the baby, as she wanted the three of them together immediately, but that they were told this was not allowed.  Adoptive parents and birth mother stated understanding of hospital's policy to not have separate room given until after relinquishments were signed, and stated that this was not the issue.  Birth mother has now signed relinquishment paperwork and adoptive parents have been granted a separate room.  They plan to continue to visit with birth mother as this is an open adoption.  Birth mother and adoptive parents did not know one another prior to this adoption process, but appear very bonded by this point.  They stated appreciation for CSW's visit and support offered.  Birth mother has no questions, concerns or needs regarding adoption and states she feels well supported in this area.  She does not feel she would like to talk with CSW at this time, but was receptive to taking CSW's phone number if she would like to talk at any point during hospitalization.  CSW is confident that counseling will be offered through [facility].  CSW obtained signed relinquishment papers and authorizations to disclose for birth mother and baby.  CSW identifies no barriers to discharge when birth mother and baby are medically ready.  Baby will be discharged into the care of a representative of [facility] when ready. 

## 2016-11-30 MED ORDER — IBUPROFEN 600 MG PO TABS: 600.0000 mg | ORAL_TABLET | Freq: Four times a day (QID) | ORAL | 0 refills | Status: DC | PRN

## 2016-11-30 MED ORDER — INFLUENZA VAC SPLIT QUAD 0.5 ML IM SUSY
0.5000 mL | PREFILLED_SYRINGE | Freq: Once | INTRAMUSCULAR | Status: AC
  Administered 2016-11-30: 0.5 mL via INTRAMUSCULAR
  Filled 2016-11-30: qty 0.5

## 2016-11-30 NOTE — Discharge Instructions (Signed)
Postpartum Care After Vaginal Delivery The period of time right after you deliver your newborn is called the postpartum period. What kind of medical care will I receive?  You may continue to receive fluids and medicines through an IV tube inserted into one of your veins.  If an incision was made near your vagina (episiotomy) or if you had some vaginal tearing during delivery, cold compresses may be placed on your episiotomy or your tear. This helps to reduce pain and swelling.  You may be given a squirt bottle to use when you go to the bathroom. You may use this until you are comfortable wiping as usual. To use the squirt bottle, follow these steps: ? Before you urinate, fill the squirt bottle with warm water. Do not use hot water. ? After you urinate, while you are sitting on the toilet, use the squirt bottle to rinse the area around your urethra and vaginal opening. This rinses away any urine and blood. ? You may do this instead of wiping. As you start healing, you may use the squirt bottle before wiping yourself. Make sure to wipe gently. ? Fill the squirt bottle with clean water every time you use the bathroom.  You will be given sanitary pads to wear. How can I expect to feel?  You may not feel the need to urinate for several hours after delivery.  You will have some soreness and pain in your abdomen and vagina.    It is normal to have vaginal bleeding (lochia) after delivery. The amount and appearance of lochia is often similar to a menstrual period in the first week after delivery. It will gradually decrease over the next few weeks to a dry, yellow-brown discharge. For most women, lochia stops completely by 6-8 weeks after delivery. Vaginal bleeding can vary from woman to woman.  Within the first few days after delivery, you may have breast engorgement. This is when your breasts feel heavy, full, and uncomfortable. Your breasts may also throb and feel hard, tightly stretched, warm,  and tender. After this occurs, you may have milk leaking from your breasts.Your health care provider can help you relieve discomfort due to breast engorgement. Breast engorgement should go away within a few days.  You may feel more sad or worried than normal due to hormonal changes after delivery. These feelings should not last more than a few days. If these feelings do not go away after several days, speak with your health care provider. How should I care for myself?  Tell your health care provider if you have pain or discomfort.  Drink enough water to keep your urine clear or pale yellow.  Wash your hands thoroughly with soap and water for at least 20 seconds after changing your sanitary pads, after using the toilet.  If you are not breastfeeding, avoid touching your breasts a lot. Doing this can make your breasts produce more milk.  If you become weak or lightheaded, or you feel like you might faint, ask for help before: ? Getting out of bed. ? Showering.  Change your sanitary pads frequently. Watch for any changes in your flow, such as a sudden increase in volume, a change in color, the passing of large blood clots. If you pass a blood clot from your vagina, save it to show to your health care provider. Do not flush blood clots down the toilet without having your health care provider look at them.  Make sure that all your vaccinations are up to date.  This can help protect you and your baby from getting certain diseases. You may need to have immunizations done before you leave the hospital.  If desired, talk with your health care provider about methods of family planning or birth control (contraception).   This information is not intended to replace advice given to you by your health care provider. Make sure you discuss any questions you have with your health care provider. Document Released: 10/16/2006 Document Revised: 05/24/2015 Document Reviewed: 11/23/2014 Elsevier Interactive  Patient Education  [employer]2018 Elsevier Inc.

## 2016-11-30 NOTE — Discharge Summary (Signed)
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 yo G3P3003 at 6775w2d was admitted to the hospital 11/28/2016 for induction of labor.  Indication for induction: Gestational hypertension.  Patient had an uncomplicated labor course as follows: Membrane Rupture Time/Date: 1:26 AM ,11/29/2016   Intrapartum Procedures: Episiotomy: None [1]                                         Lacerations:  None [1]  Patient had delivery of a Viable infant.  Information for the patient's newborn:  Catherine [name], Girl Catherine [name] [[id number]][[id number]]  Delivery Method: Vaginal, Spontaneous(Filed from Delivery Summary)   11/29/2016  Details of delivery can be found in separate delivery note.  Patient had a routine postpartum course. Patient is discharged home 11/30/16.  Physical exam  Vitals:   11/29/16 1754 11/29/16 1921 11/29/16 2258 11/30/16 0808  BP: (!) 148/83 (!) 151/80 131/73 135/86  Pulse:  92 95 90  Resp: 16 17 15 16   Temp:  97.8 F (36.6 C) 98.8 F (37.1 C) 98.6 F (37 C)  TempSrc:  Oral Oral Oral  SpO2:  100% 99% 99%  Weight:      Height:       [name]: alert, cooperative  and no distress Lochia: appropriate Uterine Fundus: firm Incision: N/A DVT Evaluation: No evidence of DVT seen on physical exam. Labs: Lab Results  Component Value Date   WBC 8.9 11/28/2016   HGB 9.3 (L) 11/28/2016   HCT 28.2 (L) 11/28/2016   MCV 83.2 11/28/2016   PLT 292 11/28/2016   CMP Latest Ref Rng & Units 11/28/2016  Glucose 65 - 99 mg/dL 83  BUN 6 - 20 mg/dL 7  Creatinine 8.840.44 - 1.661.00 mg/dL 0.630.50  Sodium 016135 - 010145 mmol/L 134(L)  Potassium 3.5 - 5.1 mmol/L 3.7  Chloride 101 - 111 mmol/L 105  CO2 22 - 32 mmol/L 18(L)  Calcium 8.9 - 10.3 mg/dL 8.9  Total Protein 6.5 - 8.1 g/dL 6.1(L)  Total Bilirubin 0.3 - 1.2 mg/dL 0.4  Alkaline Phos 38 - 126 U/L 343(H)  AST 15 - 41 U/L 27  ALT 14 - 54  U/L 24    Discharge instruction: per After Visit Summary and "Baby and Me Booklet".  After visit meds:  Allergies as of 11/30/2016      Reactions   Chantix [varenicline]    Altered mental status      Medication List    STOP taking these medications   metroNIDAZOLE 500 MG tablet Commonly known as:  FLAGYL     TAKE these medications   amphetamine-dextroamphetamine 20 MG tablet Commonly known as:  ADDERALL Take 20 mg by mouth 2 (two) times daily.   ferrous sulfate 325 (65 FE) MG tablet Take 325 mg 2 (two) times daily with a meal by mouth.   ibuprofen 600 MG tablet Commonly known as:  ADVIL,MOTRIN Take 1 tablet (600 mg total) by mouth every 6 (six) hours as needed.   Prenatal Vitamin 27-0.8 MG Tabs Take 1 tablet by mouth daily. What changed:  when to take this   ROLAIDS PO Take 1-2 tablets by mouth daily as needed (heartburn).   sertraline 100 MG tablet Commonly known as:  ZOLOFT Take 100 mg by mouth daily.       Diet: routine diet  Activity: Advance as tolerated. Pelvic rest for 6 weeks.   Outpatient follow up:2 weeks Follow up Appt: Future Appointments  Date Time Provider Department Center  01/03/2017  1:20 PM Catherine [name], Catherine [name], CNM [facility]   Follow up Visit:   Follow-up Information    [facility] in 2 week(s).   Specialty:  Obstetrics and Gynecology Why:  wants to schedule BTL Contact information: [address] [place] [id number] [phone]          Postpartum contraception: Tubal Ligation  Newborn Data: Live born female  Birth Weight: 7 lb 7.4 oz (3385 g) APGAR: 7, 9  Newborn Delivery   Birth date/time:  11/29/2016 01:35:00 Delivery type:  Vaginal, Spontaneous     Baby Feeding: n/a, for adoption Disposition:adoption service   11/30/2016 Catherine DarterJames Roan Miklos, MD

## 2016-11-30 NOTE — Progress Notes (Signed)
Discharge teaching complete with pt. Pt understood all information. Pt discharged home to family. 

## 2016-12-02 LAB — BPAM RBC
BLOOD PRODUCT EXPIRATION DATE: 201812182359
Blood Product Expiration Date: 201812222359
UNIT TYPE AND RH: 9500
UNIT TYPE AND RH: 9500

## 2016-12-02 LAB — TYPE AND SCREEN
ABO/RH(D): O NEG
ANTIBODY SCREEN: POSITIVE
UNIT DIVISION: 0
Unit division: 0

## 2016-12-05 ENCOUNTER — Encounter: Payer: Self-pay | Admitting: Obstetrics & Gynecology

## 2016-12-08 ENCOUNTER — Ambulatory Visit (INDEPENDENT_AMBULATORY_CARE_PROVIDER_SITE_OTHER): Payer: Medicaid Other | Admitting: Obstetrics and Gynecology

## 2016-12-08 ENCOUNTER — Other Ambulatory Visit (HOSPITAL_COMMUNITY)
Admission: RE | Admit: 2016-12-08 | Discharge: 2016-12-08 | Disposition: A | Discharge status: Home / Self Care | Payer: Medicaid Other | Source: Ambulatory Visit | Attending: Obstetrics and Gynecology | Admitting: Obstetrics and Gynecology

## 2016-12-08 VITALS — BP 136/91 | HR 97 | Wt 153.1 lb

## 2016-12-08 DIAGNOSIS — O0990 Supervision of high risk pregnancy, unspecified, unspecified trimester: Secondary | ICD-10-CM

## 2016-12-08 DIAGNOSIS — N939 Abnormal uterine and vaginal bleeding, unspecified: Secondary | ICD-10-CM

## 2016-12-08 MED ORDER — AMLODIPINE BESYLATE 5 MG PO TABS: 5.0000 mg | ORAL_TABLET | Freq: Every day | ORAL | 0 refills | Status: DC

## 2016-12-09 LAB — CMP AND LIVER
ALK PHOS: 303 IU/L — AB (ref 39–117)
ALT: 23 IU/L (ref 0–32)
AST: 18 IU/L (ref 0–40)
Albumin: 3.4 g/dL — ABNORMAL LOW (ref 3.5–5.5)
BILIRUBIN, DIRECT: 0.06 mg/dL (ref 0.00–0.40)
BUN: 9 mg/dL (ref 6–20)
CHLORIDE: 105 mmol/L (ref 96–106)
CO2: 23 mmol/L (ref 20–29)
Calcium: 8.7 mg/dL (ref 8.7–10.2)
Creatinine, Ser: 0.49 mg/dL — ABNORMAL LOW (ref 0.57–1.00)
GFR calc Af Amer: 148 mL/min/{1.73_m2} (ref >59)
GFR calc non Af Amer: 128 mL/min/{1.73_m2} (ref >59)
GLUCOSE: 88 mg/dL (ref 65–99)
POTASSIUM: 4.2 mmol/L (ref 3.5–5.2)
Sodium: 140 mmol/L (ref 134–144)
Total Protein: 6.4 g/dL (ref 6.0–8.5)

## 2016-12-09 LAB — CBC
Hematocrit: 33.9 % — ABNORMAL LOW (ref 34.0–46.6)
Hemoglobin: 10.7 g/dL — ABNORMAL LOW (ref 11.1–15.9)
MCH: 25.5 pg — ABNORMAL LOW (ref 26.6–33.0)
MCHC: 31.6 g/dL (ref 31.5–35.7)
MCV: 81 fL (ref 79–97)
PLATELETS: 411 10*3/uL — AB (ref 150–379)
RBC: 4.2 x10E6/uL (ref 3.77–5.28)
RDW: 15.8 % — AB (ref 12.3–15.4)
WBC: 11.8 10*3/uL — AB (ref 3.4–10.8)

## 2016-12-09 NOTE — Progress Notes (Signed)
Obstetrics Visit Problem Evaluation  Appointment Date: 12/08/2016  Chief Complaint: vaginal bleeding after delivery  History of Present Illness:  Catherine GeneralMiranda K [name] is a 31 y.o. a G3P3003 s/p 11/28 SVD/intact perineum. Pregnancy c/b IOL due to gHTN, anemia. Pt discharged to home on 11/29 on no BP medications  Interval History: Since that time, she states that she's had vaginal bleeding with clots since delivery, up until yesterday. No s/s of anemia or pre-eclampsia or abnormal engorgement or mastitis.  Admission H/H 9.3/28.2  Review of Systems: as noted in the History of Present Illness.   Medications: PNV, iron bid Allergies: is allergic to chantix [varenicline].  Physical Exam:  BP (!) 136/91   Pulse 97   Wt 153 lb 1.6 oz (69.4 kg)   LMP 04/02/2016   BMI 28.00 kg/m  Body mass index is 28 kg/m. [name] appearance: Well nourished, well developed female in no acute distress.  Abdomen: diffusely non tender to palpation, non distended, and no masses, hernias Neuro/Psych:  Normal mood and affect.  1+ brachial  Pelvic exam:  Scant old blood in vault, cervix normal and no active bleeding, vagina and perineum normal EGBUS normal.    Assessment: patient stable  Plan:  1. Abnormal uterine bleeding (AUB) Pt stable. Repeat cbc, continue iron; pt told to take with vitamin c. Pap done today - CBC  2. gHTN Recommend doing norvasc 5mg  qday which she is amenable to. Cbc, cmp done today  RTC: 1wk for bp check and 3wk pp visit  Cornelia Copaharlie Auri Jahnke, Jr MD Attending Center [facility](Faculty Practice)

## 2016-12-12 LAB — CYTOLOGY - PAP
DIAGNOSIS: NEGATIVE
HPV (WINDOPATH): NOT DETECTED

## 2016-12-15 ENCOUNTER — Ambulatory Visit: Payer: Medicaid Other | Admitting: *Deleted

## 2016-12-15 VITALS — BP 129/83 | HR 100

## 2016-12-15 DIAGNOSIS — O139 Gestational [pregnancy-induced] hypertension without significant proteinuria, unspecified trimester: Secondary | ICD-10-CM

## 2016-12-15 NOTE — Progress Notes (Signed)
Subjective:  Catherine [name] is a 31 y.o. female with hypertension. Current Outpatient Medications  Medication Sig Dispense Refill  . amLODipine (NORVASC) 5 MG tablet Take 1 tablet (5 mg total) by mouth daily. 30 tablet 0  . amphetamine-dextroamphetamine (ADDERALL) 20 MG tablet Take 20 mg by mouth 2 (two) times daily.     . Ca Carbonate-Mag Hydroxide (ROLAIDS PO) Take 1-2 tablets by mouth daily as needed (heartburn).    . ferrous sulfate 325 (65 FE) MG tablet Take 325 mg 2 (two) times daily with a meal by mouth.    Marland Kitchen. ibuprofen (ADVIL,MOTRIN) 600 MG tablet Take 1 tablet (600 mg total) by mouth every 6 (six) hours as needed. (Patient not taking: Reported on 12/08/2016) 30 tablet 0  . Prenatal Vit-Fe Fumarate-FA (PRENATAL VITAMIN) 27-0.8 MG TABS Take 1 tablet by mouth daily. (Patient not taking: Reported on 12/08/2016) 30 tablet 1  . sertraline (ZOLOFT) 100 MG tablet Take 100 mg by mouth daily.     No current facility-administered medications for this visit.     Hypertension ROS: taking medications as instructed, no medication side effects noted, no TIA's, no chest pain on exertion, no dyspnea on exertion and no swelling of ankles.   Objective:  LMP 04/02/2016   Appearance alert, well appearing, and in no distress, oriented to person, place, and time and normal appearing weight. General exam BP noted to be well controlled today in office.    Assessment:   Hypertension well controlled, stable and improved.   Plan:  Current treatment plan is effective, no change in therapy.

## 2016-12-15 NOTE — Progress Notes (Signed)
Agree with nursing staff's documentation of this patient's clinic encounter.  Catherine CollinsUgonna Omid Deardorff, MD 12/15/2016 12:17 PM

## 2017-01-03 ENCOUNTER — Ambulatory Visit: Payer: Self-pay | Admitting: Student

## 2017-07-14 ENCOUNTER — Encounter: Payer: Self-pay | Admitting: Emergency Medicine

## 2017-07-14 ENCOUNTER — Emergency Department: Payer: Medicaid Other

## 2017-07-14 ENCOUNTER — Inpatient Hospital Stay
Admission: EM | Admit: 2017-07-14 | Discharge: 2017-07-16 | DRG: 872 | Disposition: A | Discharge status: Home / Self Care | Payer: Medicaid Other | Attending: Internal Medicine | Admitting: Internal Medicine

## 2017-07-14 ENCOUNTER — Other Ambulatory Visit: Payer: Self-pay

## 2017-07-14 DIAGNOSIS — E876 Hypokalemia: Secondary | ICD-10-CM | POA: Diagnosis not present

## 2017-07-14 DIAGNOSIS — N12 Tubulo-interstitial nephritis, not specified as acute or chronic: Secondary | ICD-10-CM

## 2017-07-14 DIAGNOSIS — A419 Sepsis, unspecified organism: Secondary | ICD-10-CM

## 2017-07-14 DIAGNOSIS — Z888 Allergy status to other drugs, medicaments and biological substances status: Secondary | ICD-10-CM | POA: Diagnosis not present

## 2017-07-14 DIAGNOSIS — N10 Acute tubulo-interstitial nephritis: Secondary | ICD-10-CM | POA: Diagnosis present

## 2017-07-14 DIAGNOSIS — F329 Major depressive disorder, single episode, unspecified: Secondary | ICD-10-CM | POA: Diagnosis present

## 2017-07-14 DIAGNOSIS — F419 Anxiety disorder, unspecified: Secondary | ICD-10-CM | POA: Diagnosis present

## 2017-07-14 DIAGNOSIS — F17210 Nicotine dependence, cigarettes, uncomplicated: Secondary | ICD-10-CM | POA: Diagnosis present

## 2017-07-14 DIAGNOSIS — A4151 Sepsis due to Escherichia coli [E. coli]: Principal | ICD-10-CM | POA: Diagnosis present

## 2017-07-14 DIAGNOSIS — N3000 Acute cystitis without hematuria: Secondary | ICD-10-CM | POA: Diagnosis present

## 2017-07-14 DIAGNOSIS — R300 Dysuria: Secondary | ICD-10-CM | POA: Diagnosis present

## 2017-07-14 DIAGNOSIS — B379 Candidiasis, unspecified: Secondary | ICD-10-CM | POA: Diagnosis present

## 2017-07-14 HISTORY — DX: Tubulo-interstitial nephritis, not specified as acute or chronic: N12

## 2017-07-14 LAB — CBC
HCT: 40.4 % (ref 35.0–47.0)
Hemoglobin: 13.9 g/dL (ref 12.0–16.0)
MCH: 31.8 pg (ref 26.0–34.0)
MCHC: 34.5 g/dL (ref 32.0–36.0)
MCV: 92 fL (ref 80.0–100.0)
PLATELETS: 330 10*3/uL (ref 150–440)
RBC: 4.39 MIL/uL (ref 3.80–5.20)
RDW: 14.6 % — AB (ref 11.5–14.5)
WBC: 16.6 10*3/uL — ABNORMAL HIGH (ref 3.6–11.0)

## 2017-07-14 LAB — BASIC METABOLIC PANEL
Anion gap: 11 (ref 5–15)
BUN: 10 mg/dL (ref 6–20)
CALCIUM: 9.3 mg/dL (ref 8.9–10.3)
CO2: 23 mmol/L (ref 22–32)
Chloride: 104 mmol/L (ref 98–111)
Creatinine, Ser: 0.58 mg/dL (ref 0.44–1.00)
GFR calc Af Amer: >60 mL/min (ref >60)
GFR calc non Af Amer: >60 mL/min (ref >60)
GLUCOSE: 121 mg/dL — AB (ref 70–99)
Potassium: 3.7 mmol/L (ref 3.5–5.1)
Sodium: 138 mmol/L (ref 135–145)

## 2017-07-14 LAB — URINALYSIS, COMPLETE (UACMP) WITH MICROSCOPIC
BILIRUBIN URINE: NEGATIVE
Glucose, UA: NEGATIVE mg/dL
Ketones, ur: NEGATIVE mg/dL
NITRITE: NEGATIVE
PROTEIN: 100 mg/dL — AB
RBC / HPF: >50 RBC/hpf — ABNORMAL HIGH (ref 0–5)
SPECIFIC GRAVITY, URINE: 1.012 (ref 1.005–1.030)
WBC, UA: >50 WBC/hpf — ABNORMAL HIGH (ref 0–5)
pH: 6 (ref 5.0–8.0)

## 2017-07-14 LAB — LACTIC ACID, PLASMA: LACTIC ACID, VENOUS: 1.1 mmol/L (ref 0.5–1.9)

## 2017-07-14 LAB — PREGNANCY, URINE: PREG TEST UR: NEGATIVE

## 2017-07-14 MED ORDER — ACETAMINOPHEN 325 MG PO TABS
650.0000 mg | ORAL_TABLET | Freq: Four times a day (QID) | ORAL | Status: DC | PRN
  Administered 2017-07-15 (×2): 650 mg via ORAL
  Filled 2017-07-14 (×2): qty 2

## 2017-07-14 MED ORDER — KETOROLAC TROMETHAMINE 30 MG/ML IJ SOLN: 30.0000 mg | Freq: Four times a day (QID) | INTRAMUSCULAR | Status: DC | PRN

## 2017-07-14 MED ORDER — IOHEXOL 300 MG/ML  SOLN
100.0000 mL | Freq: Once | INTRAMUSCULAR | Status: AC | PRN
  Administered 2017-07-14: 100 mL via INTRAVENOUS

## 2017-07-14 MED ORDER — IOPAMIDOL (ISOVUE-300) INJECTION 61%: 100.0000 mL | Freq: Once | INTRAVENOUS | Status: DC | PRN

## 2017-07-14 MED ORDER — ENOXAPARIN SODIUM 40 MG/0.4ML ~~LOC~~ SOLN
40.0000 mg | SUBCUTANEOUS | Status: DC
  Administered 2017-07-14 – 2017-07-15 (×2): 40 mg via SUBCUTANEOUS
  Filled 2017-07-14 (×2): qty 0.4

## 2017-07-14 MED ORDER — ALBUTEROL SULFATE (2.5 MG/3ML) 0.083% IN NEBU: 2.5000 mg | INHALATION_SOLUTION | RESPIRATORY_TRACT | Status: DC | PRN

## 2017-07-14 MED ORDER — ONDANSETRON HCL 4 MG/2ML IJ SOLN
4.0000 mg | Freq: Four times a day (QID) | INTRAMUSCULAR | Status: DC | PRN
Start: 2017-07-14 — End: 2017-07-16

## 2017-07-14 MED ORDER — POLYETHYLENE GLYCOL 3350 17 G PO PACK: 17.0000 g | PACK | Freq: Every day | ORAL | Status: DC | PRN

## 2017-07-14 MED ORDER — ACETAMINOPHEN 650 MG RE SUPP: 650.0000 mg | Freq: Four times a day (QID) | RECTAL | Status: DC | PRN

## 2017-07-14 MED ORDER — KETOROLAC TROMETHAMINE 30 MG/ML IJ SOLN
30.0000 mg | Freq: Once | INTRAMUSCULAR | Status: AC
  Administered 2017-07-14: 30 mg via INTRAVENOUS
  Filled 2017-07-14: qty 1

## 2017-07-14 MED ORDER — IBUPROFEN 400 MG PO TABS
600.0000 mg | ORAL_TABLET | Freq: Four times a day (QID) | ORAL | Status: DC | PRN
  Administered 2017-07-14 – 2017-07-16 (×5): 600 mg via ORAL
  Filled 2017-07-14 (×5): qty 2

## 2017-07-14 MED ORDER — SODIUM CHLORIDE 0.9 % IV SOLN
1.0000 g | INTRAVENOUS | Status: DC
  Administered 2017-07-14 – 2017-07-15 (×2): 1 g via INTRAVENOUS
  Filled 2017-07-14: qty 10
  Filled 2017-07-14: qty 1
  Filled 2017-07-14: qty 10

## 2017-07-14 MED ORDER — LACTATED RINGERS IV SOLN
INTRAVENOUS | Status: DC
  Administered 2017-07-14 – 2017-07-16 (×5): via INTRAVENOUS

## 2017-07-14 MED ORDER — ONDANSETRON HCL 4 MG PO TABS: 4.0000 mg | ORAL_TABLET | Freq: Four times a day (QID) | ORAL | Status: DC | PRN

## 2017-07-14 MED ORDER — ONDANSETRON HCL 4 MG/2ML IJ SOLN
4.0000 mg | Freq: Once | INTRAMUSCULAR | Status: AC
  Administered 2017-07-14: 4 mg via INTRAVENOUS
  Filled 2017-07-14: qty 2

## 2017-07-14 MED ORDER — IOPAMIDOL (ISOVUE-300) INJECTION 61%
30.0000 mL | Freq: Once | INTRAVENOUS | Status: AC | PRN
Start: 2017-07-14 — End: 2017-07-14
  Administered 2017-07-14: 30 mL via ORAL

## 2017-07-14 MED ORDER — SODIUM CHLORIDE 0.9 % IV BOLUS
2000.0000 mL | Freq: Once | INTRAVENOUS | Status: AC
  Administered 2017-07-14: 2000 mL via INTRAVENOUS
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
## 2017-07-14 NOTE — ED Notes (Signed)
Pt to ct scan.

## 2017-07-14 NOTE — ED Notes (Signed)
Blood cultures x2 sent at this time. Kerin devices used on both cultures.

## 2017-07-14 NOTE — H&P (Signed)
[facility] - [place] [facility]   PATIENT NAME: Catherine [name]    MR#:  [id number]  DATE OF BIRTH:  12/13/1985  DATE OF ADMISSION:  07/14/2017  PRIMARY CARE PHYSICIAN: Patient, No Pcp Per   REQUESTING/REFERRING PHYSICIAN: Dr. Fanny BienQuale  CHIEF COMPLAINT:   Chief Complaint  Patient presents with  . Dysuria    HISTORY OF PRESENT ILLNESS:  Catherine [name]  is a 32 y.o. female with a known history of migraines, GERD presents to the emergency room with 5 days of dysuria, flank pain.  Patient also started spiking fevers and family brought her to the emergency room.  Here patient was found to be tachycardic, leukocytosis with sepsis and CT scan of the abdomen and pelvis showing bilateral pyelonephritis.  She was given 2 L normal saline.  Patient feels a little better with the pain after pain medications but is being admitted to the hospital for further treatment.   PAST MEDICAL HISTORY:   Past Medical History:  Diagnosis Date  . Anemia   . Anxiety   . Depression   . GERD (gastroesophageal reflux disease)   . History of colon polyps   . History of depression   . History of gastroesophageal reflux (GERD)   . History of headache   . History of migraine     PAST SURGICAL HISTORY:  History reviewed. No pertinent surgical history.  SOCIAL HISTORY:   Social History   Tobacco Use  . Smoking status: Current Every Day Smoker    Packs/day: 1.00    Types: Cigarettes  . Smokeless tobacco: Never Used  Substance Use Topics  . Alcohol use: Yes    Comment: occasional    FAMILY HISTORY:   Family History  Problem Relation Age of Onset  . Lung cancer Mother   . Heart attack Father   . Lung cancer Paternal Grandfather   . Lung cancer Paternal Grandmother   . Heart disease Maternal Grandmother   . Depression Maternal Grandfather   . Diabetes Maternal Grandfather     DRUG ALLERGIES:   Allergies  Allergen Reactions  . Chantix [Varenicline]     Altered mental  status     REVIEW OF SYSTEMS:   Review of Systems  Constitutional: Positive for malaise/fatigue. Negative for chills and fever.  HENT: Negative for sore throat.   Eyes: Negative for blurred vision, double vision and pain.  Respiratory: Negative for cough, hemoptysis, shortness of breath and wheezing.   Cardiovascular: Negative for chest pain, palpitations, orthopnea and leg swelling.  Gastrointestinal: Positive for abdominal pain and nausea. Negative for constipation, diarrhea, heartburn and vomiting.  Genitourinary: Positive for dysuria and frequency. Negative for hematuria.  Musculoskeletal: Negative for back pain and joint pain.  Skin: Negative for rash.  Neurological: Negative for sensory change, speech change, focal weakness and headaches.  Endo/Heme/Allergies: Does not bruise/bleed easily.  Psychiatric/Behavioral: Negative for depression. The patient is not nervous/anxious.    MEDICATIONS AT HOME:   Prior to Admission medications   Medication Sig Start Date End Date Taking? Authorizing Provider  amLODipine (NORVASC) 5 MG tablet Take 1 tablet (5 mg total) by mouth daily. Patient not taking: Reported on 07/14/2017 12/08/16   [place] BingPickens, Charlie, MD  amphetamine-dextroamphetamine (ADDERALL) 20 MG tablet Take 20 mg by mouth 2 (two) times daily.  01/28/14   [provider]  ibuprofen (ADVIL,MOTRIN) 600 MG tablet Take 1 tablet (600 mg total) by mouth every 6 (six) hours as needed. Patient not taking: Reported on 12/08/2016 11/30/16  Adam Phenix, MD  Prenatal Vit-Fe Fumarate-FA (PRENATAL VITAMIN) 27-0.8 MG TABS Take 1 tablet by mouth daily. Patient not taking: Reported on 12/08/2016 11/02/16   Rolm Bookbinder, CNM  sertraline (ZOLOFT) 100 MG tablet Take 100 mg by mouth daily.    [provider]     VITAL SIGNS:  Blood pressure 125/83, pulse 99, temperature 98.5 F (36.9 C), temperature source Oral, resp. rate 20, height 5\' 3"  (1.6 m), weight 63.5 kg (140 lb), last  menstrual period 06/27/2017, SpO2 100 %, unknown if currently breastfeeding.  PHYSICAL EXAMINATION:  Physical Exam  GENERAL:  32 y.o.-year-old patient lying in the bed with no acute distress.  EYES: Pupils equal, round, reactive to light and accommodation. No scleral icterus. Extraocular muscles intact.  HEENT: Head atraumatic, normocephalic. Oropharynx and nasopharynx clear. No oropharyngeal erythema, moist oral mucosa  NECK:  Supple, no jugular venous distention. No thyroid enlargement, no tenderness.  LUNGS: Normal breath sounds bilaterally, no wheezing, rales, rhonchi. No use of accessory muscles of respiration.  CARDIOVASCULAR: S1, S2 normal. No murmurs, rubs, or gallops.  ABDOMEN: Soft, nondistended. Bowel sounds present. No organomegaly or mass. Bilateral flank tenderness.  Bilateral CVA tenderness. EXTREMITIES: No pedal edema, cyanosis, or clubbing. + 2 pedal & radial pulses b/l.   NEUROLOGIC: Cranial nerves II through XII are intact. No focal Motor or sensory deficits appreciated b/l PSYCHIATRIC: The patient is alert and oriented x 3. Good affect.  SKIN: No obvious rash, lesion, or ulcer.   LABORATORY PANEL:   CBC Recent Labs  Lab 07/14/17 1357  WBC 16.6*  HGB 13.9  HCT 40.4  PLT 330   ------------------------------------------------------------------------------------------------------------------  Chemistries  Recent Labs  Lab 07/14/17 1357  NA 138  K 3.7  CL 104  CO2 23  GLUCOSE 121*  BUN 10  CREATININE 0.58  CALCIUM 9.3   ------------------------------------------------------------------------------------------------------------------  Cardiac Enzymes No results for input(s): TROPONINI in the last 168 hours. ------------------------------------------------------------------------------------------------------------------  RADIOLOGY:  Ct Abdomen Pelvis W Contrast  Result Date: 07/14/2017 CLINICAL DATA:  Right lower quadrant pain with dysuria for 1  week. Appendicitis suspected. Ultrasound equivocal. EXAM: CT ABDOMEN AND PELVIS WITH CONTRAST TECHNIQUE: Multidetector CT imaging of the abdomen and pelvis was performed using the standard protocol following bolus administration of intravenous contrast. CONTRAST:  OMNIPAQUE IOHEXOL 300 MG/ML  SOLN COMPARISON:  11/01/2004 FINDINGS: Lower chest: Clear lung bases. Normal heart size without pericardial or pleural effusion. Hepatobiliary: Focal steatosis adjacent the falciform ligament. Normal gallbladder, without biliary ductal dilatation. Pancreas: Normal, without mass or ductal dilatation. Spleen: Normal in size, without focal abnormality. Adrenals/Urinary Tract: Normal adrenal glands. Normal kidneys, without hydronephrosis. There is wall thickening and mucosal hyperenhancement involving both renal pelves and ureters. Mild surrounding edema along the path of bilateral ureters. Minimal bladder wall thickening and pericystic edema, including on 69/2. Stomach/Bowel: Normal stomach, without wall thickening. Normal colon and terminal ileum. Normal appendix, including on 59/2. Normal small bowel. Vascular/Lymphatic: Aortic and branch vessel atherosclerosis. Left periaortic node is borderline enlarged at 10 mm on 33/2, likely reactive. No abdominopelvic adenopathy. Reproductive: Retroverted uterus. Probable right ovarian corpus luteal cyst at 1.7 cm on 65/2. Other: No significant free fluid.  No free intraperitoneal air. Musculoskeletal: No acute osseous abnormality. IMPRESSION: 1. Normal appendix. 2. Findings of ascending urinary tract infection with renal pelvic and ureteric wall thickening and mucosal hyperenhancement bilaterally. More mild bladder wall findings likely represent cystitis. 3. Right ovarian corpus luteal cyst. 4. Aortic Atherosclerosis (ICD10-I70.0). This is markedly age advanced. Electronically  Signed   By: Jeronimo Greaves M.D.   On: 07/14/2017 19:33     IMPRESSION AND PLAN:   *Bilateral  pyelonephritis.  Sepsis present on admission We will start IV ceftriaxone.  IV fluids. Normal lactic acid. Culture sent and pending  *DVT prophylaxis with Lovenox  All the records are reviewed and case discussed with ED provider. Management plans discussed with the patient, family and they are in agreement.  CODE STATUS: Full code  TOTAL TIME TAKING CARE OF THIS PATIENT: 40 minutes.   Orie Fisherman M.D on 07/14/2017 at 8:39 PM  Between 7am to 6pm - Pager - [phone]  After 6pm go to [email or web address] - password [email or web address]  [facility] [place] [facility]  Office  [phone]  CC: Primary care physician; Patient, No Pcp Per  Note: This dictation was prepared with Dragon dictation along with smaller phrase technology. Any transcriptional errors that result from this process are unintentional.

## 2017-07-14 NOTE — ED Notes (Signed)
.   Pt is resting, Respirations even and unlabored, NAD. Stretcher lowest postion and locked. Call bell within reach. Denies any needs at this time RN will continue to monitor.    

## 2017-07-14 NOTE — ED Notes (Addendum)
Warm blankets provided. Room cleaned of trash per family request. Call bell at left side. Pt denies further needs.

## 2017-07-14 NOTE — ED Notes (Signed)
Report from cassie, rn.  

## 2017-07-14 NOTE — ED Triage Notes (Signed)
C/O urinary urgency, burning, frequency x 1 week.  States last night low back started to hurt and started running a fever.

## 2017-07-15 LAB — BASIC METABOLIC PANEL
ANION GAP: 8 (ref 5–15)
BUN: 5 mg/dL — AB (ref 6–20)
CO2: 24 mmol/L (ref 22–32)
Calcium: 7.9 mg/dL — ABNORMAL LOW (ref 8.9–10.3)
Chloride: 108 mmol/L (ref 98–111)
Creatinine, Ser: 0.58 mg/dL (ref 0.44–1.00)
GFR calc Af Amer: >60 mL/min (ref >60)
GFR calc non Af Amer: >60 mL/min (ref >60)
GLUCOSE: 110 mg/dL — AB (ref 70–99)
POTASSIUM: 2.9 mmol/L — AB (ref 3.5–5.1)
Sodium: 140 mmol/L (ref 135–145)

## 2017-07-15 LAB — CBC
HEMATOCRIT: 34.6 % — AB (ref 35.0–47.0)
HEMOGLOBIN: 12.1 g/dL (ref 12.0–16.0)
MCH: 32.4 pg (ref 26.0–34.0)
MCHC: 34.9 g/dL (ref 32.0–36.0)
MCV: 93 fL (ref 80.0–100.0)
Platelets: 250 10*3/uL (ref 150–440)
RBC: 3.72 MIL/uL — ABNORMAL LOW (ref 3.80–5.20)
RDW: 14.6 % — AB (ref 11.5–14.5)
WBC: 10.2 10*3/uL (ref 3.6–11.0)

## 2017-07-15 LAB — MAGNESIUM: Magnesium: 1.7 mg/dL (ref 1.7–2.4)

## 2017-07-15 MED ORDER — POTASSIUM CHLORIDE CRYS ER 20 MEQ PO TBCR
40.0000 meq | EXTENDED_RELEASE_TABLET | ORAL | Status: AC
  Administered 2017-07-15 (×2): 40 meq via ORAL
  Filled 2017-07-15 (×2): qty 2

## 2017-07-15 NOTE — Progress Notes (Signed)
[facility] - [place] at [facility]   PATIENT NAME: Catherine NordmannMiranda Lull    MR#:  [id number]  DATE OF BIRTH:  [birth year]  SUBJECTIVE:  CHIEF COMPLAINT:   Chief Complaint  Patient presents with  . Dysuria   -Fever spike again last night.  Complains of fatigue and malaise  REVIEW OF SYSTEMS:  Review of Systems  Constitutional: Positive for fever. Negative for chills and malaise/fatigue.  HENT: Negative for congestion, ear discharge, hearing loss and nosebleeds.   Eyes: Negative for blurred vision.  Respiratory: Negative for cough, shortness of breath and wheezing.   Cardiovascular: Negative for chest pain and palpitations.  Gastrointestinal: Negative for abdominal pain, constipation, diarrhea, nausea and vomiting.  Genitourinary: Positive for dysuria and flank pain.  Musculoskeletal: Positive for back pain.  Neurological: Negative for dizziness, seizures and headaches.  Psychiatric/Behavioral: Negative for depression.    DRUG ALLERGIES:   Allergies  Allergen Reactions  . Chantix [Varenicline]     Altered mental status     VITALS:  Blood pressure 117/66, pulse 99, temperature 98.6 F (37 C), temperature source Oral, resp. rate 18, height 5\' 3"  (1.6 m), weight 63.5 kg (140 lb), last menstrual period 06/27/2017, SpO2 100 %, unknown if currently breastfeeding.  PHYSICAL EXAMINATION:  Physical Exam  GENERAL:  32 y.o.-year-old patient lying in the bed with no acute distress.  EYES: Pupils equal, round, reactive to light and accommodation. No scleral icterus. Extraocular muscles intact.  HEENT: Head atraumatic, normocephalic. Oropharynx and nasopharynx clear.  NECK:  Supple, no jugular venous distention. No thyroid enlargement, no tenderness.  LUNGS: Normal breath sounds bilaterally, no wheezing, rales,rhonchi or crepitation. No use of accessory muscles of respiration.  CARDIOVASCULAR: S1, S2 normal. No murmurs, rubs, or gallops.  ABDOMEN: Soft, nontender but  discomfort on palpation in the flank region, nondistended. Bowel sounds present. No organomegaly or mass.  Right CVA tenderness is noted EXTREMITIES: No pedal edema, cyanosis, or clubbing.  NEUROLOGIC: Cranial nerves II through XII are intact. Muscle strength 5/5 in all extremities. Sensation intact. Gait not checked.  PSYCHIATRIC: The patient is alert and oriented x 3.  SKIN: No obvious rash, lesion, or ulcer.    LABORATORY PANEL:   CBC Recent Labs  Lab 07/15/17 0433  WBC 10.2  HGB 12.1  HCT 34.6*  PLT 250   ------------------------------------------------------------------------------------------------------------------  Chemistries  Recent Labs  Lab 07/15/17 0433  NA 140  K 2.9*  CL 108  CO2 24  GLUCOSE 110*  BUN 5*  CREATININE 0.58  CALCIUM 7.9*   ------------------------------------------------------------------------------------------------------------------  Cardiac Enzymes No results for input(s): TROPONINI in the last 168 hours. ------------------------------------------------------------------------------------------------------------------  RADIOLOGY:  Ct Abdomen Pelvis W Contrast  Result Date: 07/14/2017 CLINICAL DATA:  Right lower quadrant pain with dysuria for 1 week. Appendicitis suspected. Ultrasound equivocal. EXAM: CT ABDOMEN AND PELVIS WITH CONTRAST TECHNIQUE: Multidetector CT imaging of the abdomen and pelvis was performed using the standard protocol following bolus administration of intravenous contrast. CONTRAST:  100mL OMNIPAQUE IOHEXOL 300 MG/ML  SOLN COMPARISON:  11/01/2004 FINDINGS: Lower chest: Clear lung bases. Normal heart size without pericardial or pleural effusion. Hepatobiliary: Focal steatosis adjacent the falciform ligament. Normal gallbladder, without biliary ductal dilatation. Pancreas: Normal, without mass or ductal dilatation. Spleen: Normal in size, without focal abnormality. Adrenals/Urinary Tract: Normal adrenal glands. Normal  kidneys, without hydronephrosis. There is wall thickening and mucosal hyperenhancement involving both renal pelves and ureters. Mild surrounding edema along the path of bilateral ureters. Minimal bladder wall thickening and pericystic edema, including  on 69/2. Stomach/Bowel: Normal stomach, without wall thickening. Normal colon and terminal ileum. Normal appendix, including on 59/2. Normal small bowel. Vascular/Lymphatic: Aortic and branch vessel atherosclerosis. Left periaortic node is borderline enlarged at 10 mm on 33/2, likely reactive. No abdominopelvic adenopathy. Reproductive: Retroverted uterus. Probable right ovarian corpus luteal cyst at 1.7 cm on 65/2. Other: No significant free fluid.  No free intraperitoneal air. Musculoskeletal: No acute osseous abnormality. IMPRESSION: 1. Normal appendix. 2. Findings of ascending urinary tract infection with renal pelvic and ureteric wall thickening and mucosal hyperenhancement bilaterally. More mild bladder wall findings likely represent cystitis. 3. Right ovarian corpus luteal cyst. 4. Aortic Atherosclerosis (ICD10-I70.0). This is markedly age advanced. Electronically Signed   By: Jeronimo Greaves M.D.   On: 07/14/2017 19:33    EKG:   Orders placed or performed in visit on 12/15/13  . EKG 12-Lead    ASSESSMENT AND PLAN:   A 28-year-old female with past medical history significant for GERD and migraines presents to hospital secondary to dysuria and flank pain.  Noted to have sepsis.  1. Sepsis- secondary to  Acute UTI and bilateral pyelonephritis- f/u blood and urine cultures - continue rocephin - fever spike last night, wbc is improving  2. Hypokalemia- being replaced  3.  DVT prophylaxis-Lovenox   All the records are reviewed and case discussed with Care Management/Social Workerr. Management plans discussed with the patient, family and they are in agreement.  CODE STATUS: Full Code  TOTAL TIME TAKING CARE OF THIS PATIENT: 38 minutes.    POSSIBLE D/C IN 1-2 DAYS, DEPENDING ON CLINICAL CONDITION.   Enid Baas M.D on 07/15/2017 at 11:15 AM  Between 7am to 6pm - Pager - [phone]  After 6pm go to [email or web address] - password [email or web address]  [facility] [place] [facility]  Office  [phone]  CC: Primary care physician; Patient, No Pcp Per

## 2017-07-16 LAB — BASIC METABOLIC PANEL
Anion gap: 6 (ref 5–15)
CHLORIDE: 108 mmol/L (ref 98–111)
CO2: 25 mmol/L (ref 22–32)
CREATININE: 0.61 mg/dL (ref 0.44–1.00)
Calcium: 8.3 mg/dL — ABNORMAL LOW (ref 8.9–10.3)
GFR calc Af Amer: >60 mL/min (ref >60)
GFR calc non Af Amer: >60 mL/min (ref >60)
Glucose, Bld: 102 mg/dL — ABNORMAL HIGH (ref 70–99)
Potassium: 3.6 mmol/L (ref 3.5–5.1)
Sodium: 139 mmol/L (ref 135–145)

## 2017-07-16 MED ORDER — CEPHALEXIN 500 MG PO CAPS: 500.0000 mg | ORAL_CAPSULE | Freq: Four times a day (QID) | ORAL | 0 refills | Status: AC

## 2017-07-16 MED ORDER — FLUCONAZOLE 100 MG PO TABS: 100.0000 mg | ORAL_TABLET | Freq: Every day | ORAL | 0 refills | Status: AC

## 2017-07-16 NOTE — Discharge Summary (Signed)
[employer] [facility] [place] [facility]   PATIENT NAME: Catherine [name]    MR#:  [id number]  DATE OF BIRTH:  [birth year]  DATE OF ADMISSION:  07/14/2017   ADMITTING PHYSICIAN: Milagros Loll, MD  DATE OF DISCHARGE: 07/16/2017 11:21 AM  PRIMARY CARE PHYSICIAN: Patient, No Pcp Per   ADMISSION DIAGNOSIS:   Pyelonephritis [N12] Sepsis, due to unspecified organism (HCC) [A41.9]  DISCHARGE DIAGNOSIS:   Active Problems:   Pyelonephritis   SECONDARY DIAGNOSIS:   Past Medical History:  Diagnosis Date  . Anemia   . Anxiety   . Depression   . GERD (gastroesophageal reflux disease)   . History of colon polyps   . History of depression   . History of gastroesophageal reflux (GERD)   . History of headache   . History of migraine     HOSPITAL COURSE:   32 year old female with past medical history significant for GERD and migraines presents to hospital secondary to dysuria and flank pain.  Noted to have sepsis.  1. Sepsis- secondary to  Acute UTI and bilateral pyelonephritis- - urine cultures growing E.coli, blood cultures are negative - received rocephin in the hospital -Fevers, WBC have improved as well. -Being discharged on Keflex.  Also Diflucan for yeast infection while on antibiotics  2. Hypokalemia- replaced  Felt better, being discharged today   DISCHARGE CONDITIONS:   Guarded  CONSULTS OBTAINED:   None  DRUG ALLERGIES:   Allergies  Allergen Reactions  . Chantix [Varenicline]     Altered mental status    DISCHARGE MEDICATIONS:   Allergies as of 07/16/2017      Reactions   Chantix [varenicline]    Altered mental status      Medication List    STOP taking these medications   amLODipine 5 MG tablet Commonly known as:  NORVASC   amphetamine-dextroamphetamine 20 MG tablet Commonly known as:  ADDERALL   ibuprofen 600 MG tablet Commonly known as:  ADVIL,MOTRIN   Prenatal Vitamin 27-0.8 MG Tabs   sertraline 100 MG  tablet Commonly known as:  ZOLOFT     TAKE these medications   cephALEXin 500 MG capsule Commonly known as:  KEFLEX Take 1 capsule (500 mg total) by mouth 4 (four) times daily for 10 days.   fluconazole 100 MG tablet Commonly known as:  DIFLUCAN Take 1 tablet (100 mg total) by mouth daily for 10 days. While on antibiotics        DISCHARGE INSTRUCTIONS:   1. PCP f/u in 1-2 weeks  DIET:   Regular diet  ACTIVITY:   Activity as tolerated  OXYGEN:   Home Oxygen: No.  Oxygen Delivery: room air  DISCHARGE LOCATION:   home   If you experience worsening of your admission symptoms, develop shortness of breath, life threatening emergency, suicidal or homicidal thoughts you must seek medical attention immediately by calling 911 or calling your MD immediately  if symptoms less severe.  You Must read complete instructions/literature along with all the possible adverse reactions/side effects for all the Medicines you take and that have been prescribed to you. Take any new Medicines after you have completely understood and accpet all the possible adverse reactions/side effects.   Please note  You were cared for by a hospitalist during your hospital stay. If you have any questions about your discharge medications or the care you received while you were in the hospital after you are discharged, you can call the unit and asked to speak with the hospitalist  on call if the hospitalist that took care of you is not available. Once you are discharged, your primary care physician will handle any further medical issues. Please note that NO REFILLS for any discharge medications will be authorized once you are discharged, as it is imperative that you return to your primary care physician (or establish a relationship with a primary care physician if you do not have one) for your aftercare needs so that they can reassess your need for medications and monitor your lab values.    On the day of  Discharge:  VITAL SIGNS:   Blood pressure 127/72, pulse 80, temperature 98.2 F (36.8 C), temperature source Oral, resp. rate 18, height 5\' 3"  (1.6 m), weight 63.5 kg (140 lb), last menstrual period 06/27/2017, SpO2 100 %, unknown if currently breastfeeding.  PHYSICAL EXAMINATION:    GENERAL:  32 y.o.-year-old patient lying in the bed with no acute distress.  EYES: Pupils equal, round, reactive to light and accommodation. No scleral icterus. Extraocular muscles intact.  HEENT: Head atraumatic, normocephalic. Oropharynx and nasopharynx clear.  NECK:  Supple, no jugular venous distention. No thyroid enlargement, no tenderness.  LUNGS: Normal breath sounds bilaterally, no wheezing, rales,rhonchi or crepitation. No use of accessory muscles of respiration.  CARDIOVASCULAR: S1, S2 normal. No murmurs, rubs, or gallops.  ABDOMEN: Soft, nontender, nondistended. Bowel sounds present. No organomegaly or mass.  No CVA tenderness noted on exam today EXTREMITIES: No pedal edema, cyanosis, or clubbing.  NEUROLOGIC: Cranial nerves II through XII are intact. Muscle strength 5/5 in all extremities. Sensation intact. Gait not checked.  PSYCHIATRIC: The patient is alert and oriented x 3.  SKIN: No obvious rash, lesion, or ulcer.     DATA REVIEW:   CBC Recent Labs  Lab 07/15/17 0433  WBC 10.2  HGB 12.1  HCT 34.6*  PLT 250    Chemistries  Recent Labs  Lab 07/15/17 1118 07/16/17 0431  NA  --  139  K  --  3.6  CL  --  108  CO2  --  25  GLUCOSE  --  102*  BUN  --  <5*  CREATININE  --  0.61  CALCIUM  --  8.3*  MG 1.7  --      Microbiology Results  Results for orders placed or performed during the hospital encounter of 07/14/17  Urine Culture     Status: Abnormal (Preliminary result)   Collection Time: 07/14/17  1:58 PM  Result Value Ref Range Status   Specimen Description   Final    URINE, CLEAN CATCH Performed at [facility], [address] [place], [place] [address]     Special Requests   Final    Normal Performed at [facility], [address] [place], [place] [address]    Culture (A)  Final    >=100,000 COLONIES/mL ESCHERICHIA COLI SUSCEPTIBILITIES TO FOLLOW Performed at [facility], [address] [place], [place] [address]    Report Status PENDING  Incomplete  Blood Culture (routine x 2)     Status: None (Preliminary result)   Collection Time: 07/14/17  5:45 PM  Result Value Ref Range Status   Specimen Description BLOOD RIGHT FOREARM  Final   Special Requests   Final    BOTTLES DRAWN AEROBIC AND ANAEROBIC Blood Culture adequate volume   Culture   Final    NO GROWTH 2 DAYS Performed at [facility], [address] [place], [place] [address]    Report  Status PENDING  Incomplete  Blood Culture (routine x 2)     Status: None (Preliminary result)   Collection Time: 07/14/17  5:56 PM  Result Value Ref Range Status   Specimen Description BLOOD RIGHT HAND  Final   Special Requests   Final    BOTTLES DRAWN AEROBIC AND ANAEROBIC Blood Culture adequate volume Normal   Culture   Final    NO GROWTH 2 DAYS Performed at [facility], [address] [place], [place] [address]    Report Status PENDING  Incomplete    RADIOLOGY:  No results found.   Management plans discussed with the patient, family and they are in agreement.  CODE STATUS:     Code Status Orders  (From admission, onward)        Start     Ordered   07/14/17 2014  Full code  Continuous     07/14/17 2014    Code Status History    Date Active Date Inactive Code Status Order ID Comments User Context   11/29/2016 0429 11/30/2016 1458 Full Code [id number]  Lovena Neighbours, MD Inpatient   11/28/2016 1828 11/29/2016 0429 Full Code [id number]  Marthenia Rolling, DO Inpatient      TOTAL TIME TAKING CARE OF THIS PATIENT: 38 minutes.    Enid Baas M.D on 07/16/2017 at 1:47 PM  Between 7am to 6pm - Pager - [phone]  After 6pm go to  [email or web address] - Social research officer, government  [facility] [place] [facility]  Office  [phone]  CC: Primary care physician; Patient, No Pcp Per   Note: This dictation was prepared with Dragon dictation along with smaller phrase technology. Any transcriptional errors that result from this process are unintentional.

## 2017-07-16 NOTE — Progress Notes (Signed)
Pt in no acute distress. States she feels "much better" today. RN explained discharge instructions to pt and family and pt verbalized understanding. IV removed. VSS. Pt wheeled to visitors entrance and assisted into vehicle.

## 2017-07-17 LAB — URINE CULTURE
Culture: >=100000 — AB
SPECIAL REQUESTS: NORMAL

## 2017-07-19 LAB — CULTURE, BLOOD (ROUTINE X 2)
CULTURE: NO GROWTH
Culture: NO GROWTH
Special Requests: ADEQUATE

## 2017-09-22 ENCOUNTER — Emergency Department (HOSPITAL_COMMUNITY): Payer: Medicaid Other

## 2017-09-22 ENCOUNTER — Emergency Department
Admission: EM | Admit: 2017-09-22 | Discharge: 2017-09-22 | Disposition: A | Discharge status: Home / Self Care | Payer: Medicaid Other | Attending: Emergency Medicine | Admitting: Emergency Medicine

## 2017-09-22 ENCOUNTER — Observation Stay (HOSPITAL_COMMUNITY)
Admission: EM | Admit: 2017-09-22 | Discharge: 2017-09-23 | Disposition: A | Discharge status: Home / Self Care | Payer: Medicaid Other | Attending: Internal Medicine | Admitting: Internal Medicine

## 2017-09-22 ENCOUNTER — Encounter (HOSPITAL_COMMUNITY): Payer: Self-pay

## 2017-09-22 ENCOUNTER — Other Ambulatory Visit: Payer: Self-pay

## 2017-09-22 DIAGNOSIS — D649 Anemia, unspecified: Secondary | ICD-10-CM | POA: Diagnosis not present

## 2017-09-22 DIAGNOSIS — F17210 Nicotine dependence, cigarettes, uncomplicated: Secondary | ICD-10-CM | POA: Insufficient documentation

## 2017-09-22 DIAGNOSIS — K219 Gastro-esophageal reflux disease without esophagitis: Secondary | ICD-10-CM | POA: Insufficient documentation

## 2017-09-22 DIAGNOSIS — Z818 Family history of other mental and behavioral disorders: Secondary | ICD-10-CM | POA: Insufficient documentation

## 2017-09-22 DIAGNOSIS — F909 Attention-deficit hyperactivity disorder, unspecified type: Secondary | ICD-10-CM | POA: Diagnosis not present

## 2017-09-22 DIAGNOSIS — Z801 Family history of malignant neoplasm of trachea, bronchus and lung: Secondary | ICD-10-CM | POA: Diagnosis not present

## 2017-09-22 DIAGNOSIS — N12 Tubulo-interstitial nephritis, not specified as acute or chronic: Secondary | ICD-10-CM | POA: Diagnosis not present

## 2017-09-22 DIAGNOSIS — Z833 Family history of diabetes mellitus: Secondary | ICD-10-CM | POA: Diagnosis not present

## 2017-09-22 DIAGNOSIS — Z888 Allergy status to other drugs, medicaments and biological substances status: Secondary | ICD-10-CM | POA: Insufficient documentation

## 2017-09-22 DIAGNOSIS — N760 Acute vaginitis: Secondary | ICD-10-CM | POA: Insufficient documentation

## 2017-09-22 DIAGNOSIS — F419 Anxiety disorder, unspecified: Secondary | ICD-10-CM | POA: Diagnosis not present

## 2017-09-22 DIAGNOSIS — F329 Major depressive disorder, single episode, unspecified: Secondary | ICD-10-CM | POA: Insufficient documentation

## 2017-09-22 DIAGNOSIS — Z8249 Family history of ischemic heart disease and other diseases of the circulatory system: Secondary | ICD-10-CM | POA: Insufficient documentation

## 2017-09-22 DIAGNOSIS — R509 Fever, unspecified: Secondary | ICD-10-CM | POA: Diagnosis present

## 2017-09-22 LAB — COMPREHENSIVE METABOLIC PANEL
ALBUMIN: 4.5 g/dL (ref 3.5–5.0)
ALBUMIN: 3.3 g/dL — AB (ref 3.5–5.0)
ALT: 41 U/L (ref 0–44)
ALT: 25 U/L (ref 0–44)
AST: 26 U/L (ref 15–41)
AST: 15 U/L (ref 15–41)
Alkaline Phosphatase: 183 U/L — ABNORMAL HIGH (ref 38–126)
Alkaline Phosphatase: 155 U/L — ABNORMAL HIGH (ref 38–126)
Anion gap: 11 (ref 5–15)
Anion gap: 10 (ref 5–15)
BUN: 8 mg/dL (ref 6–20)
BUN: 5 mg/dL — AB (ref 6–20)
CHLORIDE: 100 mmol/L (ref 98–111)
CO2: 26 mmol/L (ref 22–32)
CO2: 22 mmol/L (ref 22–32)
CREATININE: 0.71 mg/dL (ref 0.44–1.00)
CREATININE: 0.65 mg/dL (ref 0.44–1.00)
Calcium: 9.8 mg/dL (ref 8.9–10.3)
Calcium: 8.6 mg/dL — ABNORMAL LOW (ref 8.9–10.3)
Chloride: 105 mmol/L (ref 98–111)
GFR calc Af Amer: >60 mL/min (ref >60)
GFR calc Af Amer: >60 mL/min (ref >60)
GFR calc non Af Amer: >60 mL/min (ref >60)
Glucose, Bld: 112 mg/dL — ABNORMAL HIGH (ref 70–99)
Glucose, Bld: 126 mg/dL — ABNORMAL HIGH (ref 70–99)
POTASSIUM: 3.1 mmol/L — AB (ref 3.5–5.1)
POTASSIUM: 3.8 mmol/L (ref 3.5–5.1)
SODIUM: 137 mmol/L (ref 135–145)
SODIUM: 137 mmol/L (ref 135–145)
Total Bilirubin: 0.6 mg/dL (ref 0.3–1.2)
Total Bilirubin: 0.7 mg/dL (ref 0.3–1.2)
Total Protein: 8.2 g/dL — ABNORMAL HIGH (ref 6.5–8.1)
Total Protein: 6.4 g/dL — ABNORMAL LOW (ref 6.5–8.1)

## 2017-09-22 LAB — CBC WITH DIFFERENTIAL/PLATELET
Abs Immature Granulocytes: 0.1 10*3/uL (ref 0.0–0.1)
BASOS ABS: 0.1 10*3/uL (ref 0–0.1)
BASOS PCT: 1 %
Basophils Absolute: 0.1 10*3/uL (ref 0.0–0.1)
Basophils Relative: 0 %
EOS ABS: 0.2 10*3/uL (ref 0.0–0.7)
EOS PCT: 1 %
Eosinophils Absolute: 0.2 10*3/uL (ref 0–0.7)
Eosinophils Relative: 1 %
HCT: 38.7 % (ref 35.0–47.0)
HEMATOCRIT: 35.1 % — AB (ref 36.0–46.0)
HEMOGLOBIN: 13.2 g/dL (ref 12.0–16.0)
HEMOGLOBIN: 11.3 g/dL — AB (ref 12.0–15.0)
IMMATURE GRANULOCYTES: 1 %
LYMPHS PCT: 10 %
LYMPHS PCT: 9 %
Lymphs Abs: 1.5 10*3/uL (ref 1.0–3.6)
Lymphs Abs: 1.3 10*3/uL (ref 0.7–4.0)
MCH: 31.9 pg (ref 26.0–34.0)
MCH: 31 pg (ref 26.0–34.0)
MCHC: 34.2 g/dL (ref 32.0–36.0)
MCHC: 32.2 g/dL (ref 30.0–36.0)
MCV: 93.4 fL (ref 80.0–100.0)
MCV: 96.4 fL (ref 78.0–100.0)
MONO ABS: 0.7 10*3/uL (ref 0.2–0.9)
MONOS PCT: 5 %
MONOS PCT: 6 %
Monocytes Absolute: 0.9 10*3/uL (ref 0.1–1.0)
NEUTROS PCT: 83 %
NEUTROS PCT: 83 %
Neutro Abs: 11.6 10*3/uL — ABNORMAL HIGH (ref 1.4–6.5)
Neutro Abs: 11.3 10*3/uL — ABNORMAL HIGH (ref 1.7–7.7)
PLATELETS: 418 10*3/uL (ref 150–440)
Platelets: 319 10*3/uL (ref 150–400)
RBC: 4.14 MIL/uL (ref 3.80–5.20)
RBC: 3.64 MIL/uL — ABNORMAL LOW (ref 3.87–5.11)
RDW: 15.6 % — AB (ref 11.5–14.5)
RDW: 14.3 % (ref 11.5–15.5)
WBC: 14 10*3/uL — ABNORMAL HIGH (ref 3.6–11.0)
WBC: 13.8 10*3/uL — ABNORMAL HIGH (ref 4.0–10.5)

## 2017-09-22 LAB — URINALYSIS, COMPLETE (UACMP) WITH MICROSCOPIC
BILIRUBIN URINE: NEGATIVE
Bacteria, UA: NONE SEEN
GLUCOSE, UA: NEGATIVE mg/dL
KETONES UR: NEGATIVE mg/dL
NITRITE: POSITIVE — AB
Protein, ur: NEGATIVE mg/dL
Specific Gravity, Urine: 1.004 — ABNORMAL LOW (ref 1.005–1.030)
WBC, UA: >50 WBC/hpf — ABNORMAL HIGH (ref 0–5)
pH: 6 (ref 5.0–8.0)

## 2017-09-22 LAB — POCT PREGNANCY, URINE: Preg Test, Ur: NEGATIVE

## 2017-09-22 LAB — LACTIC ACID, PLASMA
LACTIC ACID, VENOUS: 1.2 mmol/L (ref 0.5–1.9)
Lactic Acid, Venous: 1 mmol/L (ref 0.5–1.9)

## 2017-09-22 LAB — LIPASE, BLOOD: Lipase: 18 U/L (ref 11–51)

## 2017-09-22 LAB — I-STAT CG4 LACTIC ACID, ED: Lactic Acid, Venous: 0.69 mmol/L (ref 0.5–1.9)

## 2017-09-22 MED ORDER — SODIUM CHLORIDE 0.9 % IV SOLN
1.0000 g | Freq: Once | INTRAVENOUS | Status: AC
  Administered 2017-09-22: 1 g via INTRAVENOUS
  Filled 2017-09-22: qty 10

## 2017-09-22 MED ORDER — SODIUM CHLORIDE 0.9 % IV SOLN
1.0000 g | Freq: Once | INTRAVENOUS | Status: DC
  Filled 2017-09-22: qty 10

## 2017-09-22 MED ORDER — HYDROCODONE-ACETAMINOPHEN 5-325 MG PO TABS: 1.0000 | ORAL_TABLET | Freq: Four times a day (QID) | ORAL | 0 refills | Status: DC | PRN

## 2017-09-22 MED ORDER — SODIUM CHLORIDE 0.9 % IV BOLUS
1000.0000 mL | Freq: Once | INTRAVENOUS | Status: DC
  Administered 2017-09-22: 1000 mL via INTRAVENOUS

## 2017-09-22 MED ORDER — CEPHALEXIN 500 MG PO CAPS: 500.0000 mg | ORAL_CAPSULE | Freq: Four times a day (QID) | ORAL | 0 refills | Status: AC

## 2017-09-22 MED ORDER — GENTAMICIN SULFATE 40 MG/ML IJ SOLN
2.5000 mg/kg | Freq: Once | INTRAVENOUS | Status: AC
  Administered 2017-09-22: 160 mg via INTRAVENOUS
  Filled 2017-09-22: qty 4

## 2017-09-22 MED ORDER — SENNA 8.6 MG PO TABS
1.0000 | ORAL_TABLET | Freq: Two times a day (BID) | ORAL | Status: DC
  Administered 2017-09-22 – 2017-09-23 (×2): 8.6 mg via ORAL
  Filled 2017-09-22 (×2): qty 1

## 2017-09-22 MED ORDER — KETOROLAC TROMETHAMINE 30 MG/ML IJ SOLN
15.0000 mg | Freq: Once | INTRAMUSCULAR | Status: AC
  Administered 2017-09-22: 15 mg via INTRAVENOUS
  Filled 2017-09-22: qty 1

## 2017-09-22 MED ORDER — OXYCODONE HCL 5 MG PO TABS
5.0000 mg | ORAL_TABLET | ORAL | Status: DC | PRN
  Administered 2017-09-23 (×2): 5 mg via ORAL
  Filled 2017-09-22 (×2): qty 1

## 2017-09-22 MED ORDER — ONDANSETRON HCL 4 MG/2ML IJ SOLN: 4.0000 mg | Freq: Four times a day (QID) | INTRAMUSCULAR | Status: DC | PRN

## 2017-09-22 MED ORDER — ONDANSETRON HCL 4 MG/2ML IJ SOLN
4.0000 mg | Freq: Once | INTRAMUSCULAR | Status: AC
  Administered 2017-09-22: 4 mg via INTRAVENOUS
  Filled 2017-09-22: qty 2

## 2017-09-22 MED ORDER — SODIUM CHLORIDE 0.9 % IV SOLN
INTRAVENOUS | Status: AC
  Administered 2017-09-22: via INTRAVENOUS

## 2017-09-22 MED ORDER — KETOROLAC TROMETHAMINE 15 MG/ML IJ SOLN
15.0000 mg | Freq: Once | INTRAMUSCULAR | Status: AC
  Administered 2017-09-22: 15 mg via INTRAVENOUS
  Filled 2017-09-22: qty 1

## 2017-09-22 MED ORDER — SODIUM CHLORIDE 0.9 % IV BOLUS
1000.0000 mL | Freq: Once | INTRAVENOUS | Status: AC
  Administered 2017-09-22: 1000 mL via INTRAVENOUS

## 2017-09-22 MED ORDER — FENTANYL CITRATE (PF) 100 MCG/2ML IJ SOLN
50.0000 ug | Freq: Once | INTRAMUSCULAR | Status: AC
  Administered 2017-09-22: 50 ug via INTRAVENOUS
  Filled 2017-09-22: qty 2

## 2017-09-22 MED ORDER — ACETAMINOPHEN 500 MG PO TABS
1000.0000 mg | ORAL_TABLET | Freq: Once | ORAL | Status: AC
  Administered 2017-09-22: 1000 mg via ORAL
  Filled 2017-09-22: qty 2

## 2017-09-22 MED ORDER — POLYETHYLENE GLYCOL 3350 17 G PO PACK: 17.0000 g | PACK | Freq: Every day | ORAL | Status: DC | PRN

## 2017-09-22 MED ORDER — FLUCONAZOLE 100 MG PO TABS: 150.0000 mg | ORAL_TABLET | Freq: Once | ORAL | 0 refills | Status: DC

## 2017-09-22 MED ORDER — SODIUM CHLORIDE 0.9 % IV SOLN
1.0000 g | INTRAVENOUS | Status: DC
  Administered 2017-09-23: 1 g via INTRAVENOUS
  Filled 2017-09-22: qty 10

## 2017-09-22 MED ORDER — ONDANSETRON HCL 4 MG PO TABS
4.0000 mg | ORAL_TABLET | Freq: Four times a day (QID) | ORAL | Status: DC | PRN
  Administered 2017-09-23: 4 mg via ORAL
  Filled 2017-09-22: qty 1

## 2017-09-22 MED ORDER — ACETAMINOPHEN 500 MG PO TABS
1000.0000 mg | ORAL_TABLET | Freq: Three times a day (TID) | ORAL | Status: DC
  Administered 2017-09-22 – 2017-09-23 (×2): 1000 mg via ORAL
  Filled 2017-09-22 (×2): qty 2

## 2017-09-22 MED ORDER — ENOXAPARIN SODIUM 40 MG/0.4ML ~~LOC~~ SOLN
40.0000 mg | SUBCUTANEOUS | Status: DC
  Administered 2017-09-23: 40 mg via SUBCUTANEOUS
  Filled 2017-09-22: qty 0.4

## 2017-09-22 NOTE — ED Triage Notes (Signed)
Patient states that she went to [facility] this a.m. for a kidney infection. Patient is here for a fever, N/V. States had IV abx at hospital and two doses of abx at home since d/c.

## 2017-09-22 NOTE — Discharge Instructions (Signed)
Please take all of your antibiotics as prescribed and only fill your Diflucan if you develop a yeast infection.  Make sure you remain well-hydrated and follow-up with primary care as needed.  Return to the emergency department for any new or worsening symptoms such as worsening pain, fevers, chills, or for any other issues whatsoever.  It was a pleasure to take care of you today, and thank you for coming to our emergency department.  If you have any questions or concerns before leaving please ask the nurse to grab me and I'm more than happy to go through your aftercare instructions again.  If you were prescribed any opioid pain medication today such as Norco, Vicodin, Percocet, morphine, hydrocodone, or oxycodone please make sure you do not drive when you are taking this medication as it can alter your ability to drive safely.  If you have any concerns once you are home that you are not improving or are in fact getting worse before you can make it to your follow-up appointment, please do not hesitate to call 911 and come back for further evaluation.  Merrily BrittleNeil Pier Bosher, MD  Results for orders placed or performed during the hospital encounter of 09/22/17  Comprehensive metabolic panel  Result Value Ref Range   Sodium 137 135 - 145 mmol/L   Potassium 3.1 (L) 3.5 - 5.1 mmol/L   Chloride 100 98 - 111 mmol/L   CO2 26 22 - 32 mmol/L   Glucose, Bld 112 (H) 70 - 99 mg/dL   BUN 8 6 - 20 mg/dL   Creatinine, Ser 6.570.71 0.44 - 1.00 mg/dL   Calcium 9.8 8.9 - 84.610.3 mg/dL   Total Protein 8.2 (H) 6.5 - 8.1 g/dL   Albumin 4.5 3.5 - 5.0 g/dL   AST 26 15 - 41 U/L   ALT 41 0 - 44 U/L   Alkaline Phosphatase 183 (H) 38 - 126 U/L   Total Bilirubin 0.6 0.3 - 1.2 mg/dL   GFR calc non Af Amer >60 >60 mL/min   GFR calc Af Amer >60 >60 mL/min   Anion gap 11 5 - 15  Lactic acid, plasma  Result Value Ref Range   Lactic Acid, Venous 1.2 0.5 - 1.9 mmol/L  Lactic acid, plasma  Result Value Ref Range   Lactic Acid, Venous  1.0 0.5 - 1.9 mmol/L  CBC with Differential  Result Value Ref Range   WBC 14.0 (H) 3.6 - 11.0 K/uL   RBC 4.14 3.80 - 5.20 MIL/uL   Hemoglobin 13.2 12.0 - 16.0 g/dL   HCT 96.238.7 95.235.0 - 84.147.0 %   MCV 93.4 80.0 - 100.0 fL   MCH 31.9 26.0 - 34.0 pg   MCHC 34.2 32.0 - 36.0 g/dL   RDW 32.415.6 (H) 40.111.5 - 02.714.5 %   Platelets 418 150 - 440 K/uL   Neutrophils Relative % 83 %   Neutro Abs 11.6 (H) 1.4 - 6.5 K/uL   Lymphocytes Relative 10 %   Lymphs Abs 1.5 1.0 - 3.6 K/uL   Monocytes Relative 5 %   Monocytes Absolute 0.7 0.2 - 0.9 K/uL   Eosinophils Relative 1 %   Eosinophils Absolute 0.2 0 - 0.7 K/uL   Basophils Relative 1 %   Basophils Absolute 0.1 0 - 0.1 K/uL  Urinalysis, Complete w Microscopic  Result Value Ref Range   Color, Urine AMBER (A) YELLOW   APPearance CLEAR (A) CLEAR   Specific Gravity, Urine 1.004 (L) 1.005 - 1.030   pH 6.0 5.0 -  8.0   Glucose, UA NEGATIVE NEGATIVE mg/dL   Hgb urine dipstick SMALL (A) NEGATIVE   Bilirubin Urine NEGATIVE NEGATIVE   Ketones, ur NEGATIVE NEGATIVE mg/dL   Protein, ur NEGATIVE NEGATIVE mg/dL   Nitrite POSITIVE (A) NEGATIVE   Leukocytes, UA SMALL (A) NEGATIVE   RBC / HPF 0-5 0 - 5 RBC/hpf   WBC, UA >50 (H) 0 - 5 WBC/hpf   Bacteria, UA NONE SEEN NONE SEEN   Squamous Epithelial / LPF 0-5 0 - 5   Mucus PRESENT   Pregnancy, urine POC  Result Value Ref Range   Preg Test, Ur NEGATIVE NEGATIVE

## 2017-09-22 NOTE — Progress Notes (Signed)
CODE SEPSIS - PHARMACY COMMUNICATION  **Broad Spectrum Antibiotics should be administered within 1 hour of Sepsis diagnosis**  Time Code Sepsis Called/Page Received: 1610 [id number]  Antibiotics Ordered: 0921 0340  Time of 1st antibiotic administration: 0921 0415  Additional action taken by pharmacy:   If necessary, Name of Provider/Nurse Contacted:     Catherine [name],Catherine [name] ,PharmD Clinical Pharmacist  09/22/2017  5:24 AM

## 2017-09-22 NOTE — H&P (Signed)
Date: 09/22/2017               Patient Name:  Catherine [name] MRN: [id number]  DOB: [birth year] Age / Sex: 32 y.o., female   PCP: Patient, No Pcp Per         Medical Service: Internal Medicine Teaching Service         Attending Physician: Dr. Blanch Media    First Contact: Dr. Erich Montane Pager: [phone]  Second Contact: Dr. Margot Ables Pager: [phone]       After Hours (After 5p/  First Contact Pager: [phone]  weekends / holidays): Second Contact Pager: [phone]   Chief Complaint: Back pain, fever, and nausea  History of Present Illness:   Ms. Catherine [name] is a 32 year old female with a history of tobacco abuse and ADHD who presented with back pain, nausea, and incomplete voiding.  Ms. Rizzolo was in her usual state of health till 5 days prior to presentation when she began to have sensations of not being able to empty her bladder appropriately without dysuria or hematuria. She states that she only gets these symptoms whenever she "has a UTI". The next day she had an abrupt onset of lower back pain which self-resolved. Her back pain returned last night. She then seen this morning in the  [facility] ED with fever, tachycardia, flank pain, and found to have a urinalysis consistent with urinary tract infection. She was diagnosed with pyelonephritis and treated with 2 liters of NS, 2 grams ceftriaxone and a dose of gentamycin. She was then discharged home with Keflex.  She states that several hours after returning home (around 5 hours prior to presentation) she began to experience emesis after a nap and had a home oral temp of 103F. She became very concerned and sought care in our ED.  Of note, she was admitted to the [facility] from 07/14/17 to 07/16/17 for sepsis secondary acute ITI and bilateral pyelonephritis. Urine cultures grew E.coli and blood cultures negative. CT scan showed renal wall thickening and hyper-enhancement involving both renal pelvises and ureters as well as minimal bladder  wall thickening and pericystic edema. No signs of urinary obstruction. She received rocephin in the hospital and was discharged on Keflex.   She denies history of renal stones or renal structural abnormalities.   Meds: Current Meds  Medication Sig  . acetaminophen (TYLENOL) 500 MG tablet Take 1,000 mg by mouth every 6 (six) hours as needed for fever or headache (pain).  . cephALEXin (KEFLEX) 500 MG capsule Take 1 capsule (500 mg total) by mouth 4 (four) times daily for 10 days.  . fluconazole (DIFLUCAN) 100 MG tablet Take 1.5 tablets (150 mg total) by mouth once for 1 dose.  Marland Kitchen HYDROcodone-acetaminophen (NORCO) 5-325 MG tablet Take 1 tablet by mouth every 6 (six) hours as needed for up to 7 doses for severe pain. (Patient taking differently: Take 0.5-1 tablets by mouth every 6 (six) hours as needed for severe pain. )     Allergies: Allergies as of 09/22/2017 - Review Complete 09/22/2017  Allergen Reaction Noted  . Chantix [varenicline] Other (See Comments) 08/04/2013   Past Medical History:  Diagnosis Date  . Anemia   . Anxiety   . Depression   . GERD (gastroesophageal reflux disease)   . History of colon polyps   . History of depression   . History of gastroesophageal reflux (GERD)   . History of headache   . History of migraine     Family  History: No family history of renal stones or urinary structural abnormalities  Social History: Endorses cigarette smoking.   Review of Systems: A complete ROS was negative except as per HPI.   Physical Exam: Blood pressure 127/87, pulse (!) 123, temperature 99.8 F (37.7 C), temperature source Oral, resp. rate 17, last menstrual period 09/15/2017, SpO2 99 %, unknown if currently breastfeeding. Physical Exam  Constitutional: She appears well-developed and well-nourished.  HENT:  Head: Normocephalic and atraumatic.  Eyes: EOM are normal.  Neck: Normal range of motion.  Cardiovascular: Regular rhythm and normal heart sounds.    Tachycardic into the 120-125  Pulmonary/Chest: Effort normal and breath sounds normal.  Abdominal:  Tenderness to palpation to the suprapubic region as well as right upper and lower quadrant.   Genitourinary:  Genitourinary Comments: Positive CVA tenderness bilaterally (left=right)  Musculoskeletal: She exhibits no edema.  Neurological: She is alert.  Skin: Skin is warm and dry.  Psychiatric: She has a normal mood and affect. Her behavior is normal.  Nursing note and vitals reviewed.   EKG: personally reviewed my interpretation is sinus tachycardia  CXR: personally reviewed my interpretation is normal lung fields bilaterally. No acute abnormal cardiopulmonary process appreciated.  Assessment & Plan by Problem: Active Problems:   Pyelonephritis  Ms. Knab signs/symptoms of incomplete voiding, nausea, bilateral CVA tenderness as well as her abnormal urinalysis (positive nitrites, leukocytes, and hematuria) are all consistent with pyelonephritis. Given that her previous urine culture grew E.coli susceptible to third generation cephalosporins, I believe that it is appropriate to continue Ceftriaxone 1 g q24h. I do not believe that imaging is warranted at this time as I do not suspect urinary tract obstruction (renal function remains stable and she denies a precipitous decline in urinary output). If her symptoms persist despite 48 to 72 hours of antimicrobial therapy, we will consider ordering a CT scan vs broaden her antibiotic coverage to include ESBL-producing Enterobacteriaceae (she has a history of broad-spectrum beta-lactam use which makes her at higher risk for multidrug-resistant gram-negative urinary tract infection).   #Pyelonephritis - Ceftriaxone 1 g IV q24h - acetaminophen 1000 mg 18h - Oxycodone 5 mg q4h PRN pain - NS @ 100 mL/he  #DVT Prophylaxis - enoxaparin 40 mg IV q24h  #Bowel regiment - Senokot 8.6 mg BID - Miralax 17 g daily PRN constipation   Dispo: Admit  patient to Observation with expected length of stay less than 2 midnights.  Signed: Synetta ShadowPrince, Danniela Mcbrearty M, MD 09/22/2017, 9:45 PM  Pager: [phone]
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
## 2017-09-22 NOTE — ED Triage Notes (Signed)
Pt states back pain, nausea, urinary frequency. Pt states tonight began to have vomiting. Pt states has had urinary symptoms since Monday. Pt appears ill.

## 2017-09-22 NOTE — ED Notes (Signed)
Patient transported to X-ray 
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
## 2017-09-22 NOTE — Progress Notes (Addendum)
Patient request code status change from DNR to FULL Code.   Admitting paged/notified.

## 2017-09-22 NOTE — ED Notes (Signed)
Pt returned from xray

## 2017-09-23 LAB — BASIC METABOLIC PANEL
ANION GAP: 9 (ref 5–15)
BUN: 6 mg/dL (ref 6–20)
CHLORIDE: 107 mmol/L (ref 98–111)
CO2: 23 mmol/L (ref 22–32)
Calcium: 7.8 mg/dL — ABNORMAL LOW (ref 8.9–10.3)
Creatinine, Ser: 0.63 mg/dL (ref 0.44–1.00)
GFR calc Af Amer: >60 mL/min (ref >60)
GFR calc non Af Amer: >60 mL/min (ref >60)
GLUCOSE: 104 mg/dL — AB (ref 70–99)
POTASSIUM: 3.4 mmol/L — AB (ref 3.5–5.1)
Sodium: 139 mmol/L (ref 135–145)

## 2017-09-23 LAB — CBC
HEMATOCRIT: 32.8 % — AB (ref 36.0–46.0)
HEMOGLOBIN: 10.5 g/dL — AB (ref 12.0–15.0)
MCH: 31.1 pg (ref 26.0–34.0)
MCHC: 32 g/dL (ref 30.0–36.0)
MCV: 97 fL (ref 78.0–100.0)
Platelets: 238 10*3/uL (ref 150–400)
RBC: 3.38 MIL/uL — ABNORMAL LOW (ref 3.87–5.11)
RDW: 14.3 % (ref 11.5–15.5)
WBC: 10.4 10*3/uL (ref 4.0–10.5)

## 2017-09-23 LAB — HIV ANTIBODY (ROUTINE TESTING W REFLEX): HIV Screen 4th Generation wRfx: NONREACTIVE

## 2017-09-23 MED ORDER — FLUCONAZOLE 100 MG PO TABS: 150.0000 mg | ORAL_TABLET | Freq: Once | ORAL | 0 refills | Status: AC

## 2017-09-23 NOTE — Progress Notes (Signed)
   Subjective:  Catherine [name] is a 32 y.o. with PMH of GERD, Depression, Anxiety, Anemia admit for bilateral pyelonephritis on hospital day 0  Catherine [name] was examined and evaluated at bedside this a.m.  She states in her prior episode of pyelonephritis her symptoms had completely resolved after taking her antibiotics.  Believes she had a yeast infection before onset and wonders if that may have been the trigger for her UTI.  She is has had no prior episodes or history of UTIs in the past.  She said for this episode she was not sexually active as she was recently menstruating.  She is unclear what the inciting event for her UTI was.  Explained to the patient about her antibiotics requiring time to work.  Also strongly encouraged the patient to establish a PCP.  Gave patient internal medicine sent to her business card.  Patient expressed understanding and stated she would call on weekday.  Denies any F/N/V/D/C.  Able to tolerate breakfast.  Still endorsing moderate lower back pain.  However she states it has decreased in severity.   Objective:  Vital signs in last 24 hours: Vitals:   09/23/17 0228 09/23/17 0635 09/23/17 0856 09/23/17 1033  BP: 105/67 99/65  114/70  Pulse: 95 (!) 106  82  Resp: 18 16  18   Temp: 98.3 F (36.8 C) 99.1 F (37.3 C) 99 F (37.2 C) 99.2 F (37.3 C)  TempSrc: Oral Oral Oral Oral  SpO2: 98% 100%  100%  Weight:      Height:       Physical Exam  Constitutional: She is oriented to person, place, and time and well-developed, well-nourished, and in no distress. No distress.  Eyes: Pupils are equal, round, and reactive to light. Conjunctivae and EOM are normal. No scleral icterus.  Neck: Normal range of motion. Neck supple.  Cardiovascular: Normal rate, regular rhythm, normal heart sounds and intact distal pulses.  Pulmonary/Chest: Effort normal and breath sounds normal. No respiratory distress. She has no rales.  Abdominal: Soft. Bowel sounds are normal. She  exhibits no distension. There is tenderness (tenderness to palpation on hypogastric region. Tenderness to deep palpation on R&L flanks. R>L). There is no rebound and no guarding.  Genitourinary:  Genitourinary Comments: CVA tenderness R>L  Musculoskeletal: Normal range of motion. She exhibits no edema.  Neurological: She is alert and oriented to person, place, and time. No cranial nerve deficit. GCS score is 15.  Skin: Skin is warm and dry. She is not diaphoretic.  Psychiatric: Mood, memory, affect and judgment normal.   Assessment/Plan:  Active Problems:   Pyelonephritis  Catherine [name] is a 32 y.o. with PMH of GERD, Depression, Anxiety, Anemia admit for bilateral pyelonephritis. She has been afebrile since midnight and have been able to tolerate meals. No significant systemic symptoms. Continuing to endorse CVA tenderness. Most likely her abxs just needed time to take effect. Will discharge today with close follow up for urine cultures and call to change outpatient abx if culture results show resistance.  Bilateral Pyelonephritis w/ Cystitis 2/2 E.Coli infection UA + for nitrite, leukocytes and wbc. Urine culture prelim show E.Coli w/ susceptibilities pending - Discharge today w/ Keflex and Diflucan - F/u urine & blood cultures  Dispo: Anticipated discharge in approximately today(s).   Theotis BarrioLee, Rose-Marie Hickling K, MD 09/23/2017, 10:59 AM Pager: [phone]

## 2017-09-23 NOTE — Discharge Instructions (Signed)
Catherine [name]  You came to us with urinary tract infection. You have not had any fevers for 12 hours and we feel that you are safe to be discharged. Here are recommendations for you at discharge:  - Continue taking your cephalexin (Keflex) 500mg  4 times a day for 9 more days - Make sure to take your diflucan 150mg  once if you have not taken it yet. - Please call the internal medicine center at [phone] to make a follow up appointment on Monday  Thank you for choosing [facility]

## 2017-09-23 NOTE — Discharge Summary (Signed)
Name: Catherine [name] MRN: [id number] DOB: [birth year] 31 y.o. PCP: Patient, No Pcp Per  Date of Admission: 09/22/2017  7:24 PM Date of Discharge: 09/23/2017 2:00 PM Attending Physician: Blanch Media Discharge Diagnosis: 1. Bilateral Pyelonephritis  Discharge Medications: Allergies as of 09/23/2017      Reactions   Chantix [varenicline] Other (See Comments)   Altered mental status      Medication List    STOP taking these medications   HYDROcodone-acetaminophen 5-325 MG tablet Commonly known as:  NORCO/VICODIN     TAKE these medications   acetaminophen 500 MG tablet Commonly known as:  TYLENOL Take 1,000 mg by mouth every 6 (six) hours as needed for fever or headache (pain).   cephALEXin 500 MG capsule Commonly known as:  KEFLEX Take 1 capsule (500 mg total) by mouth 4 (four) times daily for 10 days.   fluconazole 100 MG tablet Commonly known as:  DIFLUCAN Take 1.5 tablets (150 mg total) by mouth once for 1 dose.       Disposition and follow-up:   Catherine [name] was discharged from [facility] in Stable condition.  At the hospital follow up visit please address:  1.  Bilateral Pyelonephritis:  - Urine culture grew E.Coli but sensitivities pending - Discharged on Keflex - Please adjust abx regimen as needed after sensitivities result.  2.  Labs / imaging needed at time of follow-up: CBC  3.  Pending labs/ test needing follow-up: Urine / Blood culture  Follow-up Appointments:   Please call the internal medicine center at [phone] to make a follow up appointment on Monday  Hospital Course by problem list: 1. Bilateral pyelonephritis: Presented with bilateral flank pain, fever, nausea, abdominal pain.  Found to have UA with positive nitrites and white count, leukocytosis at 14.  Chest x-ray negative.  Blood and urine culture collected.  Started on ceftriaxone and gentamicin.  Given 2 L NS bolus.  No longer febrile overnight.   Morning labs showed resolved leukocytosis with white count of 10.4.  Urine culture E. coli with sensitivities pending.  Discharged on Keflex and 1 time dose of Diflucan.  Discharge Vitals:   BP 118/84 (BP Location: Right Arm)   Pulse 84   Temp 99.1 F (37.3 C) (Oral)   Resp 18   Ht 5\' 2"  (1.575 m)   Wt 63.5 kg   LMP 09/15/2017   SpO2 100%   BMI 25.61 kg/m   Pertinent Labs, Studies, and Procedures:  CBC Latest Ref Rng & Units 09/23/2017 09/22/2017 09/22/2017  WBC 4.0 - 10.5 K/uL 10.4 13.8(H) 14.0(H)  Hemoglobin 12.0 - 15.0 g/dL 10.5(L) 11.3(L) 13.2  Hematocrit 36.0 - 46.0 % 32.8(L) 35.1(L) 38.7  Platelets 150 - 400 K/uL 238 319 418   Results for orders placed or performed during the hospital encounter of 09/22/17  Culture, blood (Routine x 2)     Status: None (Preliminary result)   Collection Time: 09/22/17  2:52 AM  Result Value Ref Range Status   Specimen Description BLOOD LEFT ANTECUBITAL  Final   Special Requests   Final    BOTTLES DRAWN AEROBIC AND ANAEROBIC Blood Culture results may not be optimal due to an excessive volume of blood received in culture bottles   Culture   Final    NO GROWTH 1 DAY Performed at [facility], [address] [place], [place] [address]    Report Status PENDING  Incomplete  Urine culture     Status: Abnormal (Preliminary result)  Collection Time: 09/22/17  2:52 AM  Result Value Ref Range Status   Specimen Description   Final    URINE, RANDOM Performed at [facility], [address] [place], [place] [address]    Special Requests   Final    NONE Performed at [facility], [address] [place], [place] [address]    Culture (A)  Final    50,000 COLONIES/mL ESCHERICHIA COLI SUSCEPTIBILITIES TO FOLLOW Performed at [facility], [address] [place], [place] [address]    Report Status PENDING  Incomplete  Culture, blood (Routine x 2)     Status: None (Preliminary result)   Collection Time:  09/22/17  3:08 AM  Result Value Ref Range Status   Specimen Description BLOOD LEFT ARM  Final   Special Requests   Final    BOTTLES DRAWN AEROBIC AND ANAEROBIC Blood Culture adequate volume   Culture   Final    NO GROWTH 1 DAY Performed at [facility], [address] [place], [place] [address]    Report Status PENDING  Incomplete    Discharge Instructions: You came to us with urinary tract infection. You have not had any fevers for 12 hours and we feel that you are safe to be discharged. Here are recommendations for you at discharge:  - Continue taking your cephalexin (Keflex) 500mg  4 times a day for 9 more days - Make sure to take your diflucan 150mg  once if you have not taken it yet. - Please call the internal medicine center at [phone] to make a follow up appointment on Monday  Thank you for choosing [facility]  Discharge Instructions    Call MD for:  persistant dizziness or light-headedness   Complete by:  As directed    Call MD for:  persistant nausea and vomiting   Complete by:  As directed    Call MD for:  severe uncontrolled pain   Complete by:  As directed    Call MD for:  temperature >100.4   Complete by:  As directed    Diet - low sodium heart healthy   Complete by:  As directed    Increase activity slowly   Complete by:  As directed       Signed: Theotis BarrioLee, Jamaine Quintin K, MD 09/23/2017, 9:58 PM   Pager: [phone]

## 2017-09-24 LAB — URINE CULTURE: Culture: 50000 — AB

## 2017-09-27 LAB — CULTURE, BLOOD (ROUTINE X 2)
Culture: NO GROWTH
Culture: NO GROWTH
Special Requests: ADEQUATE

## 2017-10-01 ENCOUNTER — Ambulatory Visit: Payer: Medicaid Other | Admitting: Internal Medicine

## 2017-10-01 ENCOUNTER — Encounter: Payer: Self-pay | Admitting: Internal Medicine

## 2017-10-01 VITALS — BP 134/98 | HR 99 | Temp 98.2°F | Wt 140.9 lb

## 2017-10-01 DIAGNOSIS — Z792 Long term (current) use of antibiotics: Secondary | ICD-10-CM | POA: Diagnosis not present

## 2017-10-01 DIAGNOSIS — F900 Attention-deficit hyperactivity disorder, predominantly inattentive type: Secondary | ICD-10-CM

## 2017-10-01 DIAGNOSIS — N12 Tubulo-interstitial nephritis, not specified as acute or chronic: Secondary | ICD-10-CM

## 2017-10-01 DIAGNOSIS — R748 Abnormal levels of other serum enzymes: Secondary | ICD-10-CM | POA: Insufficient documentation

## 2017-10-01 DIAGNOSIS — K635 Polyp of colon: Secondary | ICD-10-CM | POA: Diagnosis not present

## 2017-10-01 DIAGNOSIS — Z79899 Other long term (current) drug therapy: Secondary | ICD-10-CM | POA: Diagnosis not present

## 2017-10-01 DIAGNOSIS — Z23 Encounter for immunization: Secondary | ICD-10-CM | POA: Diagnosis not present

## 2017-10-01 DIAGNOSIS — Z87440 Personal history of urinary (tract) infections: Secondary | ICD-10-CM | POA: Diagnosis not present

## 2017-10-01 DIAGNOSIS — Z87448 Personal history of other diseases of urinary system: Secondary | ICD-10-CM

## 2017-10-01 DIAGNOSIS — N920 Excessive and frequent menstruation with regular cycle: Secondary | ICD-10-CM

## 2017-10-01 DIAGNOSIS — Z720 Tobacco use: Secondary | ICD-10-CM

## 2017-10-01 DIAGNOSIS — F909 Attention-deficit hyperactivity disorder, unspecified type: Secondary | ICD-10-CM

## 2017-10-01 DIAGNOSIS — D649 Anemia, unspecified: Secondary | ICD-10-CM | POA: Diagnosis not present

## 2017-10-01 HISTORY — DX: Abnormal levels of other serum enzymes: R74.8

## 2017-10-01 NOTE — Assessment & Plan Note (Signed)
Patient with resolved symptoms.   Plan: --she will complete course of keflex 500mg  QID --if she continues having recurrent UTIs, can consider chronic suppressive therapy in the future

## 2017-10-01 NOTE — Assessment & Plan Note (Signed)
Referral made to psychiatry for management of ADHD. Patient previously on adderral.

## 2017-10-01 NOTE — Progress Notes (Signed)
   CC: pyelonephritis  HPI:  [name] is a 32 y.o. with a PMH of ADHD, presenting to clinic for   Patient with recent hospitalization for bil pyelonephritis (9/21-9/22) based on UA and symptoms of bil flank pain, N/V with leukocytosis; initially started on ceftriaxone and gentamicin and then switched to keflex. Urine cultures grew 50K E coli (resistent to ampicillin), and blood cultures were negative.   Patient states that her hematuria, flank pain, nausea and vomiting have all resolved. She is tolerating keflex 500mg  QID and is almost finished with the course; she has had some diarrhea today only but thinks it's from nerves. She states that before July, she has never had UTIs before.  Patient endorses heavy periods using ~10 tampons per day for 2 days when her period is heaviest; periods have been like this since birth of child in Nov 2018. CBC in the hospital showed trend down of her Hgb from 12.1-13.2 to 10.5. She denies hematochezia, melena or current hematuria. Patient does state she had a colonoscopy when she was around 16 due to abnormal findings in her father and also due to irritable bowel; found to have polyps, she doesn't think she was told to follow up.  Please see problem based Assessment and Plan for status of patients chronic conditions.  Past Medical History:  Diagnosis Date  . Anemia   . Anxiety   . Depression   . GERD (gastroesophageal reflux disease)   . History of colon polyps   . History of depression   . History of gastroesophageal reflux (GERD)   . History of headache   . History of migraine     Review of Systems:   Per HPI  Physical Exam:  Vitals:   10/01/17 0927  BP: (!) 134/98  Pulse: 99  Temp: 98.2 F (36.8 C)  TempSrc: Oral  SpO2: 100%  Weight: 140 lb 14.4 oz (63.9 kg)   GENERAL- alert, co-operative, appears as stated age, not in any distress. CARDIAC- RRR, no murmurs, rubs or gallops. RESP- Moving equal volumes of air, and clear to  auscultation bilaterally, no wheezes or crackles. ABDOMEN- Soft, nontender, bowel sounds present. No CVA tenderness SKIN- Warm, dry PSYCH- Normal mood and affect, appropriate thought content and speech.  Assessment & Plan:   See Encounters Tab for problem based charting.   Patient discussed with Dr. Cherene Altes, MD Internal Medicine PGY-3

## 2017-10-01 NOTE — Assessment & Plan Note (Addendum)
Patient with elevated alk phos since Nov 2018 but seems to be trending down. She denies illicit drug use or family h/o of liver disease.   Plan: --repeat hepatic panel --f/u GGT, vit D level; if GGT elevated will pursue viral hepatitis testing  Addendum: GGT normal and vit D level low indicating alk phos elevation likely from bone turnover. Will supplement with 1000 IU vit D daily and repeat labs in a few weeks

## 2017-10-01 NOTE — Patient Instructions (Signed)
Continue the keflex until you finish all the pills.  We will check some labs today including your blood counts and some labs to check why your alkaline phosphatase level might be elevated.  [facility] so they can address your heavy periods.  We will refer you to psychiatry who will be able to manage your ADD and restart your adderall.

## 2017-10-01 NOTE — Assessment & Plan Note (Signed)
Patient with ongoing tobacco use; previously tried Chantix but had adverse effects. She is not interested in cessation at this time. We discussed use of nicotine patches or wellbutrin as possible aids to stopping smoking in the future.

## 2017-10-01 NOTE — Assessment & Plan Note (Addendum)
Patient with normocytic anemia with nadir at 10.5 during recent hospitalization in setting of menorrhagia.  Plan: --f/u CBC, ferritin - hgb and ferritin normal --she will see gyn for management of menorrhagia

## 2017-10-01 NOTE — Assessment & Plan Note (Addendum)
Patient with several month h/o menorrhagia and now possible anemia.  Plan: --she [facility]agia --f/u CBC and ferritin  Addendum: Hgb back to normal, ferritin normal ?elevated platelets - consider repeating next visit

## 2017-10-02 LAB — CBC
Hematocrit: 35.7 % (ref 34.0–46.6)
Hemoglobin: 11.9 g/dL (ref 11.1–15.9)
MCH: 30.7 pg (ref 26.6–33.0)
MCHC: 33.3 g/dL (ref 31.5–35.7)
MCV: 92 fL (ref 79–97)
PLATELETS: 732 10*3/uL — AB (ref 150–450)
RBC: 3.88 x10E6/uL (ref 3.77–5.28)
RDW: 13.8 % (ref 12.3–15.4)
WBC: 8.1 10*3/uL (ref 3.4–10.8)

## 2017-10-02 LAB — HEPATIC FUNCTION PANEL
ALK PHOS: 178 IU/L — AB (ref 39–117)
ALT: 10 IU/L (ref 0–32)
AST: 9 IU/L (ref 0–40)
Albumin: 4.5 g/dL (ref 3.5–5.5)
BILIRUBIN, DIRECT: 0.06 mg/dL (ref 0.00–0.40)
Total Protein: 6.9 g/dL (ref 6.0–8.5)

## 2017-10-02 LAB — GAMMA GT: GGT: 54 IU/L (ref 0–60)

## 2017-10-02 LAB — VITAMIN D 25 HYDROXY (VIT D DEFICIENCY, FRACTURES): VIT D 25 HYDROXY: 19.5 ng/mL — AB (ref 30.0–100.0)

## 2017-10-02 LAB — FERRITIN: FERRITIN: 20 ng/mL (ref 15–150)

## 2017-10-04 NOTE — Progress Notes (Signed)
Internal Medicine Clinic Attending  Case discussed with Dr. Svalina  at the time of the visit.  We reviewed the resident's history and exam and pertinent patient test results.  I agree with the assessment, diagnosis, and plan of care documented in the resident's note.  

## 2017-10-05 ENCOUNTER — Encounter: Payer: Self-pay | Admitting: Internal Medicine

## 2017-10-05 MED ORDER — VITAMIN D 1000 UNITS PO TABS: 1000.0000 [IU] | ORAL_TABLET | Freq: Every day | ORAL | 3 refills | Status: DC

## 2017-10-05 NOTE — Addendum Note (Signed)
Addended by: Nyra Market on: 10/05/2017 07:21 PM   Modules accepted: Orders

## 2017-10-20 ENCOUNTER — Encounter: Payer: Self-pay | Admitting: Internal Medicine

## 2017-12-02 NOTE — Progress Notes (Deleted)
CC: Follow up of anemia and healthcare maintanence  HPI:  [name] is a 32 y.o.  with a PMH listed below presenting for ***   Patient was noted to have an elevated alkaline phosphatase since November 2018, she had a normal GGT and a low vitamin D level on her last labs.  Vitamin D supplementation was started.  Today she reports***.  Plan: -Repeat CMP and vitamin D level  Normocytic anemia: Patient was recently hospitalized for bilateral pyelonephritis and was noted to have a down trending hemoglobin to 10.5 with an MCV of 97. Most recent hemoglobin was normal at 11.9. Ferritin on 9/30 was 20. She also has a history of heavy periods and uses up to 10 tampons a day.  She was referred to gynecology for management of menorrhagia.  Possible that the anemia is secondary to her heavy periods.  Patient reports***.  Plan:  -Continue follow-up with gynecology -Consider repeating on 3 month follow-up  Please see A&P for status of the patient's chronic medical conditions  Past Medical History:  Diagnosis Date  . Anemia   . Anxiety   . Depression   . GERD (gastroesophageal reflux disease)   . History of colon polyps   . History of depression   . History of gastroesophageal reflux (GERD)   . History of headache   . History of migraine    Review of Systems: Refer to history of present illness and assessment and plans for pertinent review of systems, all others reviewed and negative.  Physical Exam:  There were no vitals filed for this visit. *** Physical Exam  Social History   Socioeconomic History  . Marital status: Married    Spouse name: Not on file  . Number of children: Not on file  . Years of education: Not on file  . Highest education level: Not on file  Occupational History  . Not on file  Social Needs  . Financial resource strain: Not hard at all  . Food insecurity:    Worry: Never true    Inability: Never true  . Transportation needs:    Medical: No      Non-medical: No  Tobacco Use  . Smoking status: Current Every Day Smoker    Packs/day: 1.00    Types: Cigarettes  . Smokeless tobacco: Never Used  Substance and Sexual Activity  . Alcohol use: Yes    Comment: occasional  . Drug use: No    Comment: aderall  zoloft  . Sexual activity: Yes  Lifestyle  . Physical activity:    Days per week: 7 days    Minutes per session: 30 min  . Stress: Rather much  Relationships  . Social connections:    Talks on phone: More than three times a week    Gets together: Twice a week    Attends religious service: Never    Active member of club or organization: No    Attends meetings of clubs or organizations: Never    Relationship status: Married  . Intimate partner violence:    Fear of current or ex partner: No    Emotionally abused: No    Physically abused: No    Forced sexual activity: No  Other Topics Concern  . Not on file  Social History Narrative  . Not on file   *** Family History  Problem Relation Age of Onset  . Lung cancer Mother   . Heart attack Father   . Lung cancer Paternal Grandfather   .  Lung cancer Paternal Grandmother   . Heart disease Maternal Grandmother   . Depression Maternal Grandfather   . Diabetes Maternal Grandfather     Assessment & Plan:   See Encounters Tab for problem based charting.  Patient seen with Dr. Mathis Dad. Hoffman","Klima","Mullen","Narendra","Raines","Vincent"}

## 2017-12-03 ENCOUNTER — Encounter: Payer: Self-pay | Admitting: Internal Medicine

## 2017-12-06 ENCOUNTER — Telehealth: Payer: Self-pay | Admitting: Licensed Clinical Social Worker

## 2017-12-06 NOTE — Telephone Encounter (Signed)
Patient was contacted to follow up on a referral for ADHD testing. Patient did not answer, but a voicemail was left for the patient.  I plan to contact the patient again to discuss the referral for psychiatry.

## 2017-12-18 ENCOUNTER — Telehealth: Payer: Self-pay | Admitting: Licensed Clinical Social Worker

## 2017-12-18 NOTE — Telephone Encounter (Signed)
Patient was contacted for the second time to discuss the referral made by her PCP.  Patient did not answer, and a voicemail was left.

## 2017-12-24 ENCOUNTER — Telehealth: Payer: Self-pay | Admitting: Licensed Clinical Social Worker

## 2017-12-24 NOTE — Telephone Encounter (Signed)
Patient was contacted one more time to follow up on the referral from her PCP. Patient did not answer, and voicemail was left.

## 2019-03-29 IMAGING — US US MFM OB LIMITED
1 series · 15 of 19 positions shown · non-contrast
Comparison: none

[Series 1: us mfm ob limited · 19 acquisitions, 15 frames shown]
[im 1/19]
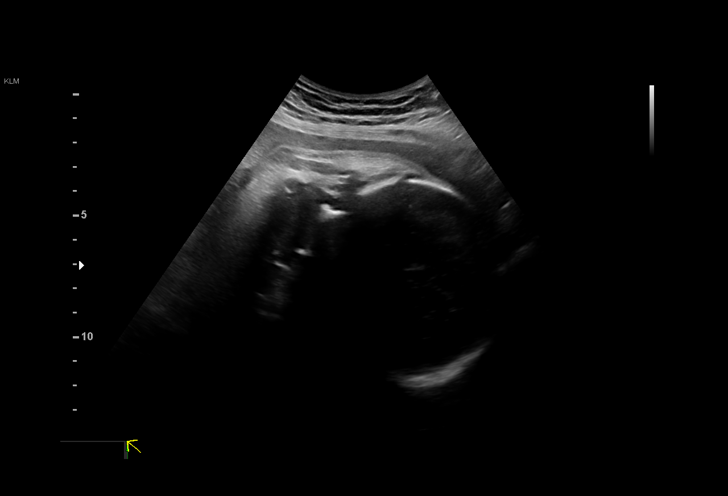
[im 2/19]
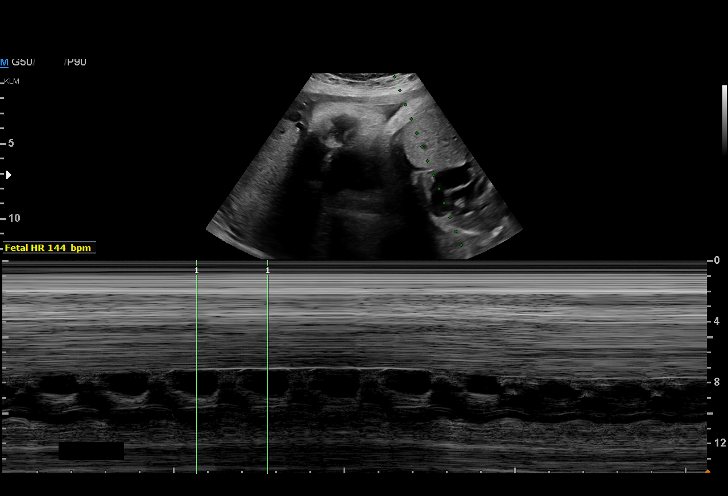
[im 4/19]
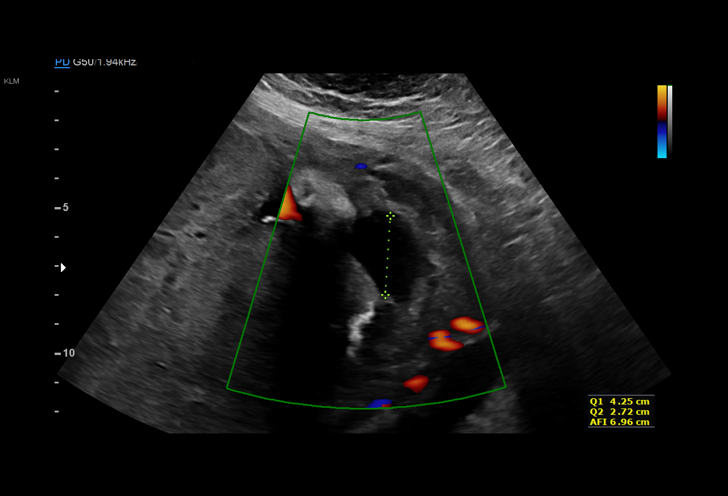
[im 5/19]
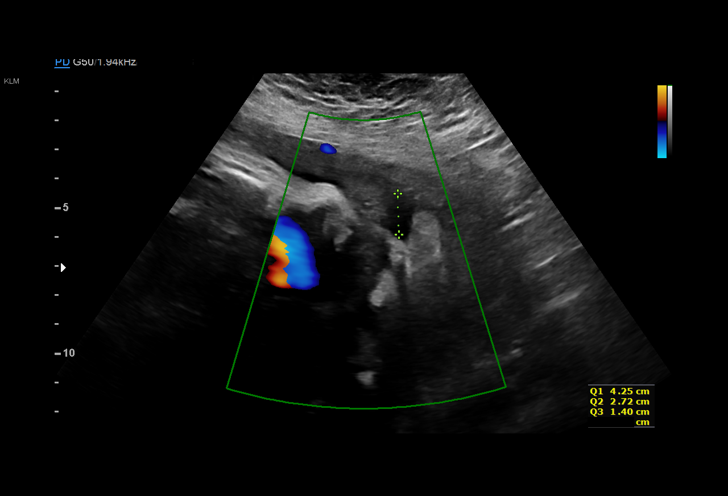
[im 6/19]
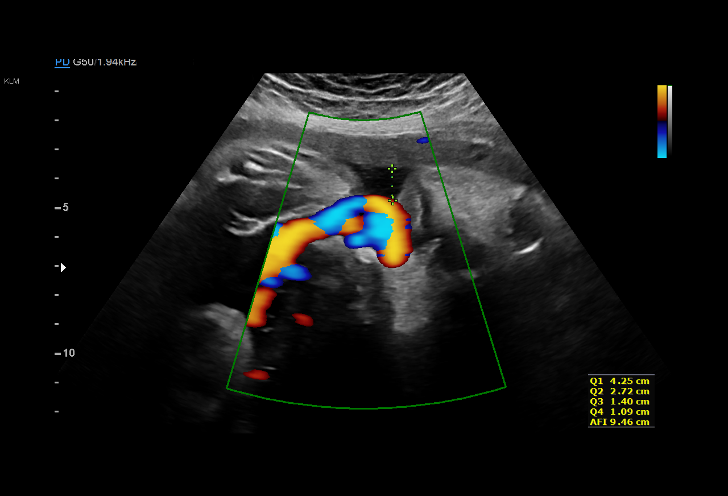
[im 7/19]
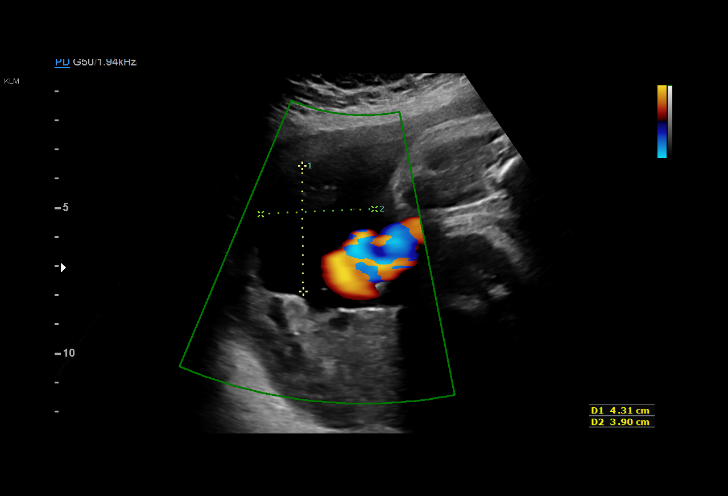
[im 9/19]
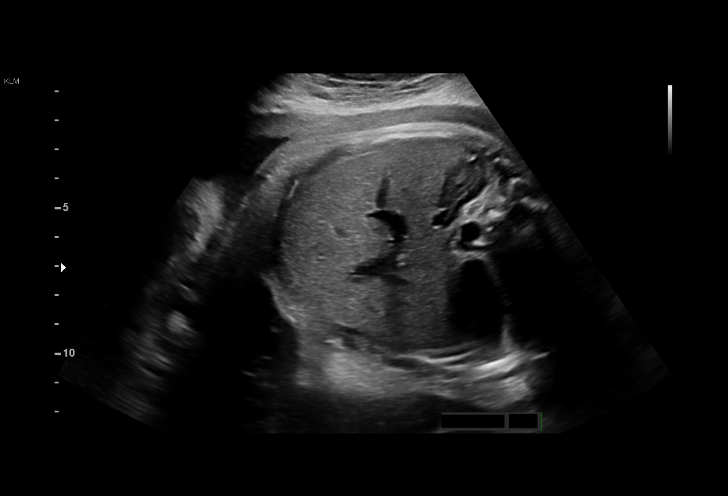
[im 10/19]
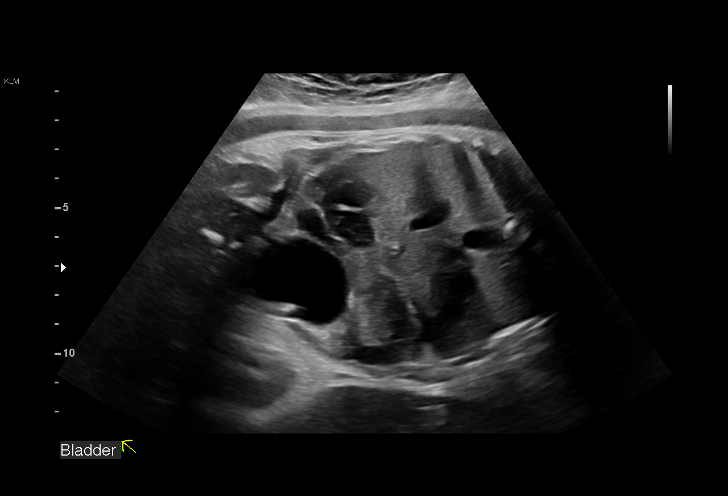
[im 11/19]
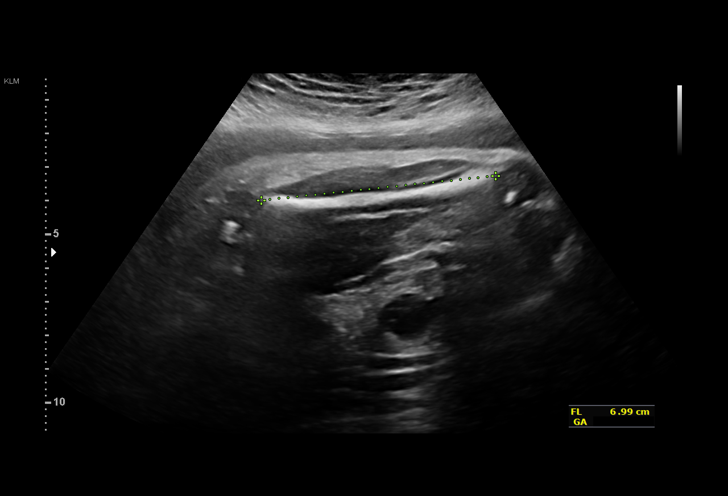
[im 13/19]
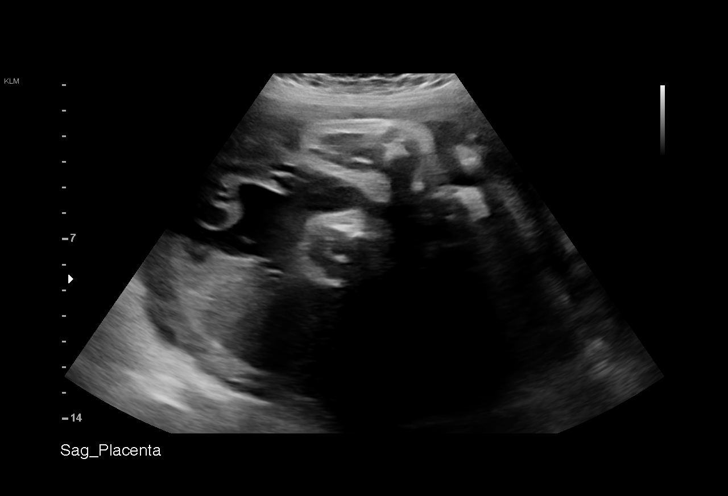
[im 14/19]
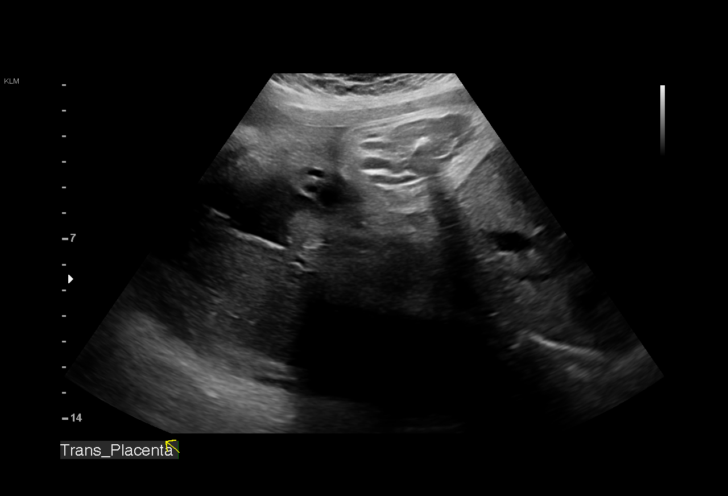
[im 15/19]
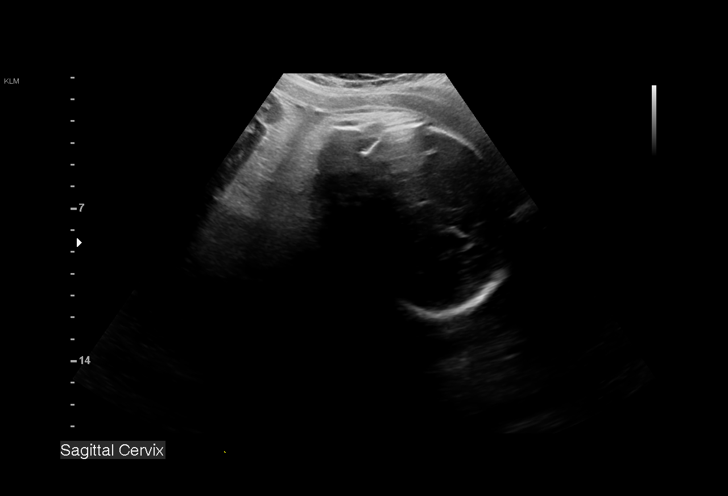
[im 16/19]
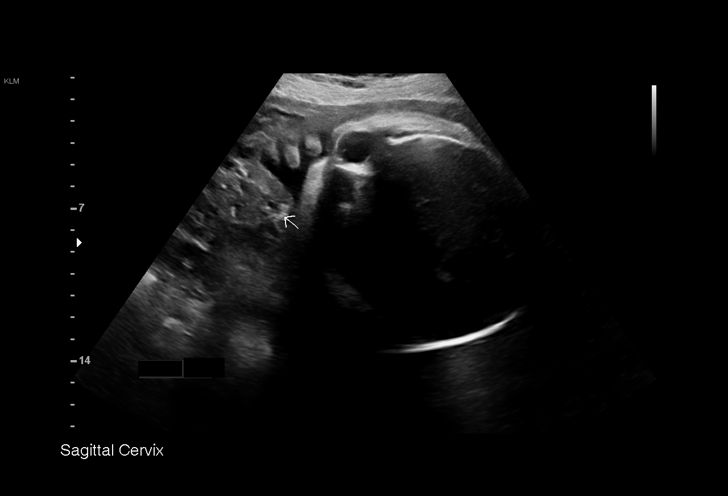
[im 18/19]
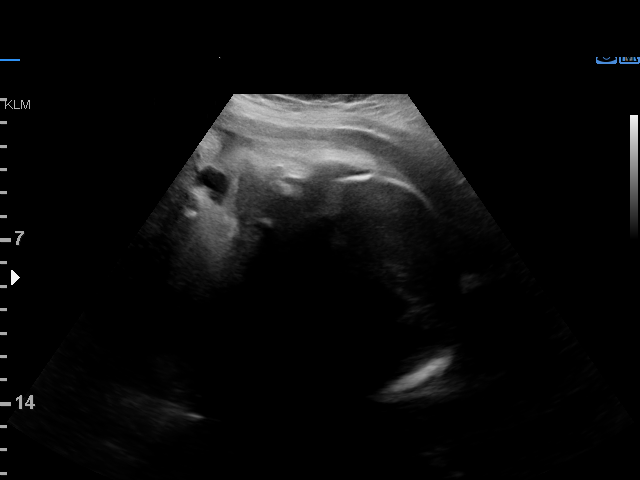
[im 19/19]
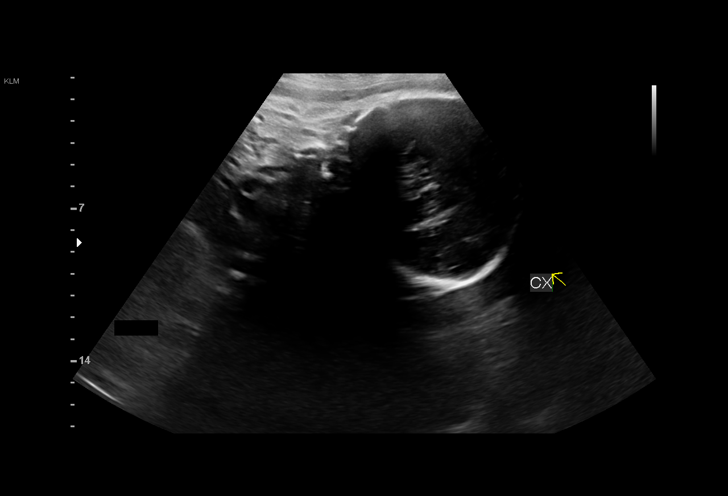

[15 of 19 positions shown; findings below may reference images not displayed]

Attending:        Sarvesh Christen         Secondary Phy.:   [facility] Nursing-
MAU/Triage

1  SHYRA ONEAL           [id number]      [id number]     [id number]
Indications

Weeks of gestation of pregnancy not
specified
Insufficient Prenatal Care (No prenatal care)
Abdominal pain in pregnancy
OB History

Gravidity:    3         Term:   2        Prem:   0        SAB:   0
TOP:          0       Ectopic:  0        Living: 2
Fetal Evaluation

Num Of Fetuses:     1
Fetal Heart         144
Rate(bpm):
Cardiac Activity:   Observed
Presentation:       Cephalic
Placenta:           Posterior, above cervical os
P. Cord Insertion:  Not well visualized

Amniotic Fluid
AFI FV:      Subjectively within normal limits

AFI Sum(cm)                 Largest Pocket(cm)
9.46

RUQ(cm)       RLQ(cm)       LUQ(cm)        LLQ(cm)
4.25
Biometry

BPD:      86.7  mm     G. Age:  35w 0d
FL/BPD:     80.6   %
FL:       69.9  mm     G. Age:  35w 6d
Gestational Age

LMP:           30w 4d        Date:  04/02/16                 EDD:   01/07/17
U/S Today:     35w 3d                                        EDD:   12/04/16
Anatomy

Stomach:               Appears normal, left   Bladder:                Appears normal
sided
Cervix Uterus Adnexa

Cervix
Not visualized (advanced GA >83wks)
Impression

Single living intrauterine pregnancy at approximately 35
weeks by limited biometry today.
Placenta Posterior, above cervical os.
Normal amniotic fluid volume.
Anatomy limited by advanced gestational age.
No gross anomalies visualized.

## 2019-05-15 ENCOUNTER — Ambulatory Visit: Payer: Self-pay | Attending: Internal Medicine

## 2019-05-15 DIAGNOSIS — Z23 Encounter for immunization: Secondary | ICD-10-CM

## 2019-05-15 NOTE — Progress Notes (Signed)
   Covid-19 Vaccination Clinic  Name:  Catherine [name]    MRN: [id number] DOB: 19-May-1985  05/15/2019  [name] was observed post Covid-19 immunization for 15 minutes without incident. She was provided with Vaccine Information Sheet and instruction to access the V-Safe system.   [name] was instructed to call 911 with any severe reactions post vaccine: Marland Kitchen Difficulty breathing  . Swelling of face and throat  . A fast heartbeat  . A bad rash all over body  . Dizziness and weakness   Immunizations Administered    Name Date Dose VIS Date Route   Pfizer COVID-19 Vaccine 05/15/2019  1:37 PM 0.3 mL 02/26/2018 Intramuscular   Manufacturer: [employer], [employer]   Lot: [id number]   NDC: [address]-3

## 2019-06-09 ENCOUNTER — Ambulatory Visit: Payer: Medicaid Other | Attending: Internal Medicine

## 2019-06-09 DIAGNOSIS — Z23 Encounter for immunization: Secondary | ICD-10-CM

## 2019-06-09 NOTE — Progress Notes (Signed)
   Covid-19 Vaccination Clinic  Name:  SHARICA ROEDEL    MRN: [id number] DOB: September 26, 1985  06/09/2019  Ms. Krah was observed post Covid-19 immunization for 15 minutes without incident. She was provided with Vaccine Information Sheet and instruction to access the V-Safe system.   Ms. Burback was instructed to call 911 with any severe reactions post vaccine: Marland Kitchen Difficulty breathing  . Swelling of face and throat  . A fast heartbeat  . A bad rash all over body  . Dizziness and weakness   Immunizations Administered    Name Date Dose VIS Date Route   Pfizer COVID-19 Vaccine 06/09/2019  4:08 PM 0.3 mL 02/26/2018 Intramuscular   Manufacturer: [employer], [employer]   Lot: [id number]   NDC: [address]-2

## 2020-02-16 IMAGING — DX DG CHEST 2V
2 series · 2 of 2 positions shown · non-contrast
Comparison: 07/17/2013

CLINICAL DATA: Fever with nausea vomiting.

EXAM:
CHEST - 2 VIEW

[w chest pa]
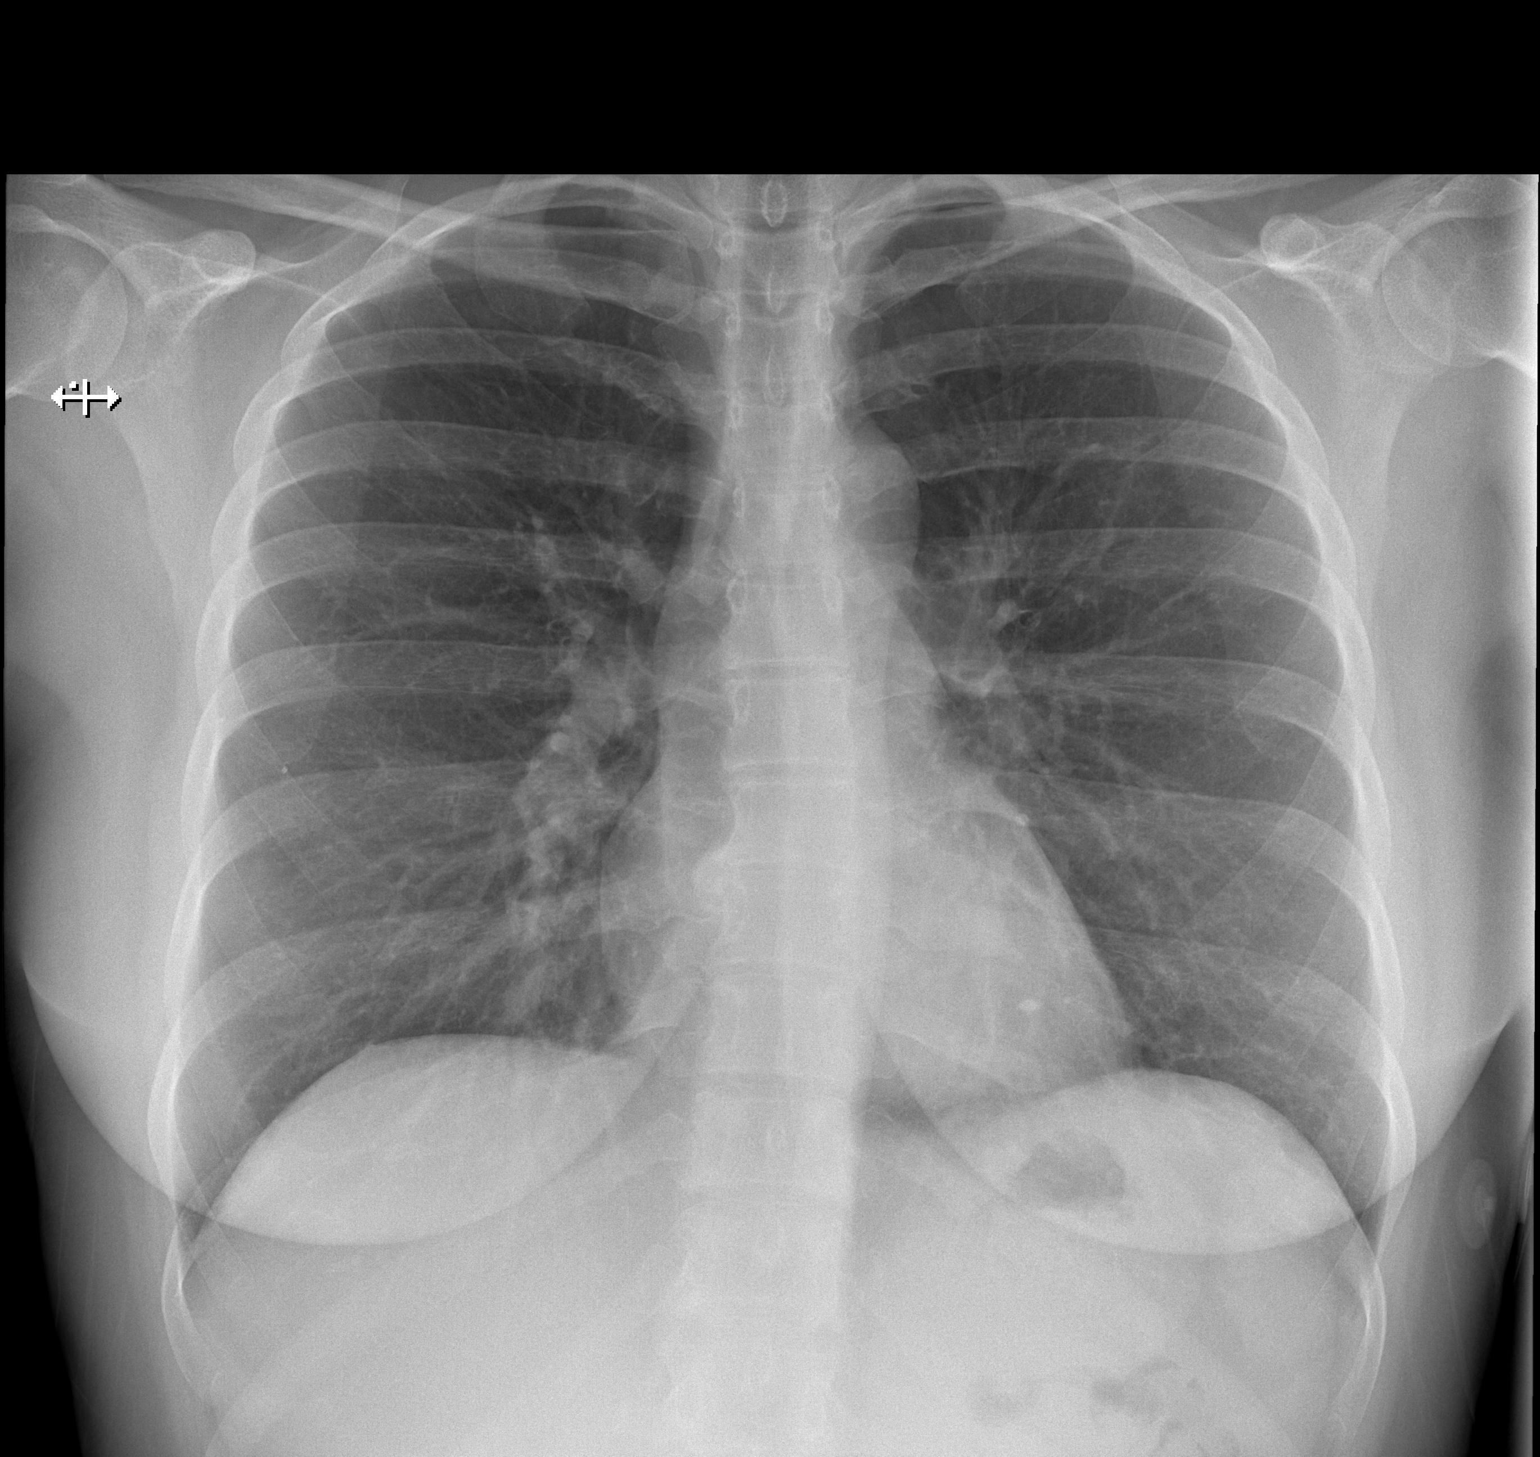

[w chest lat]
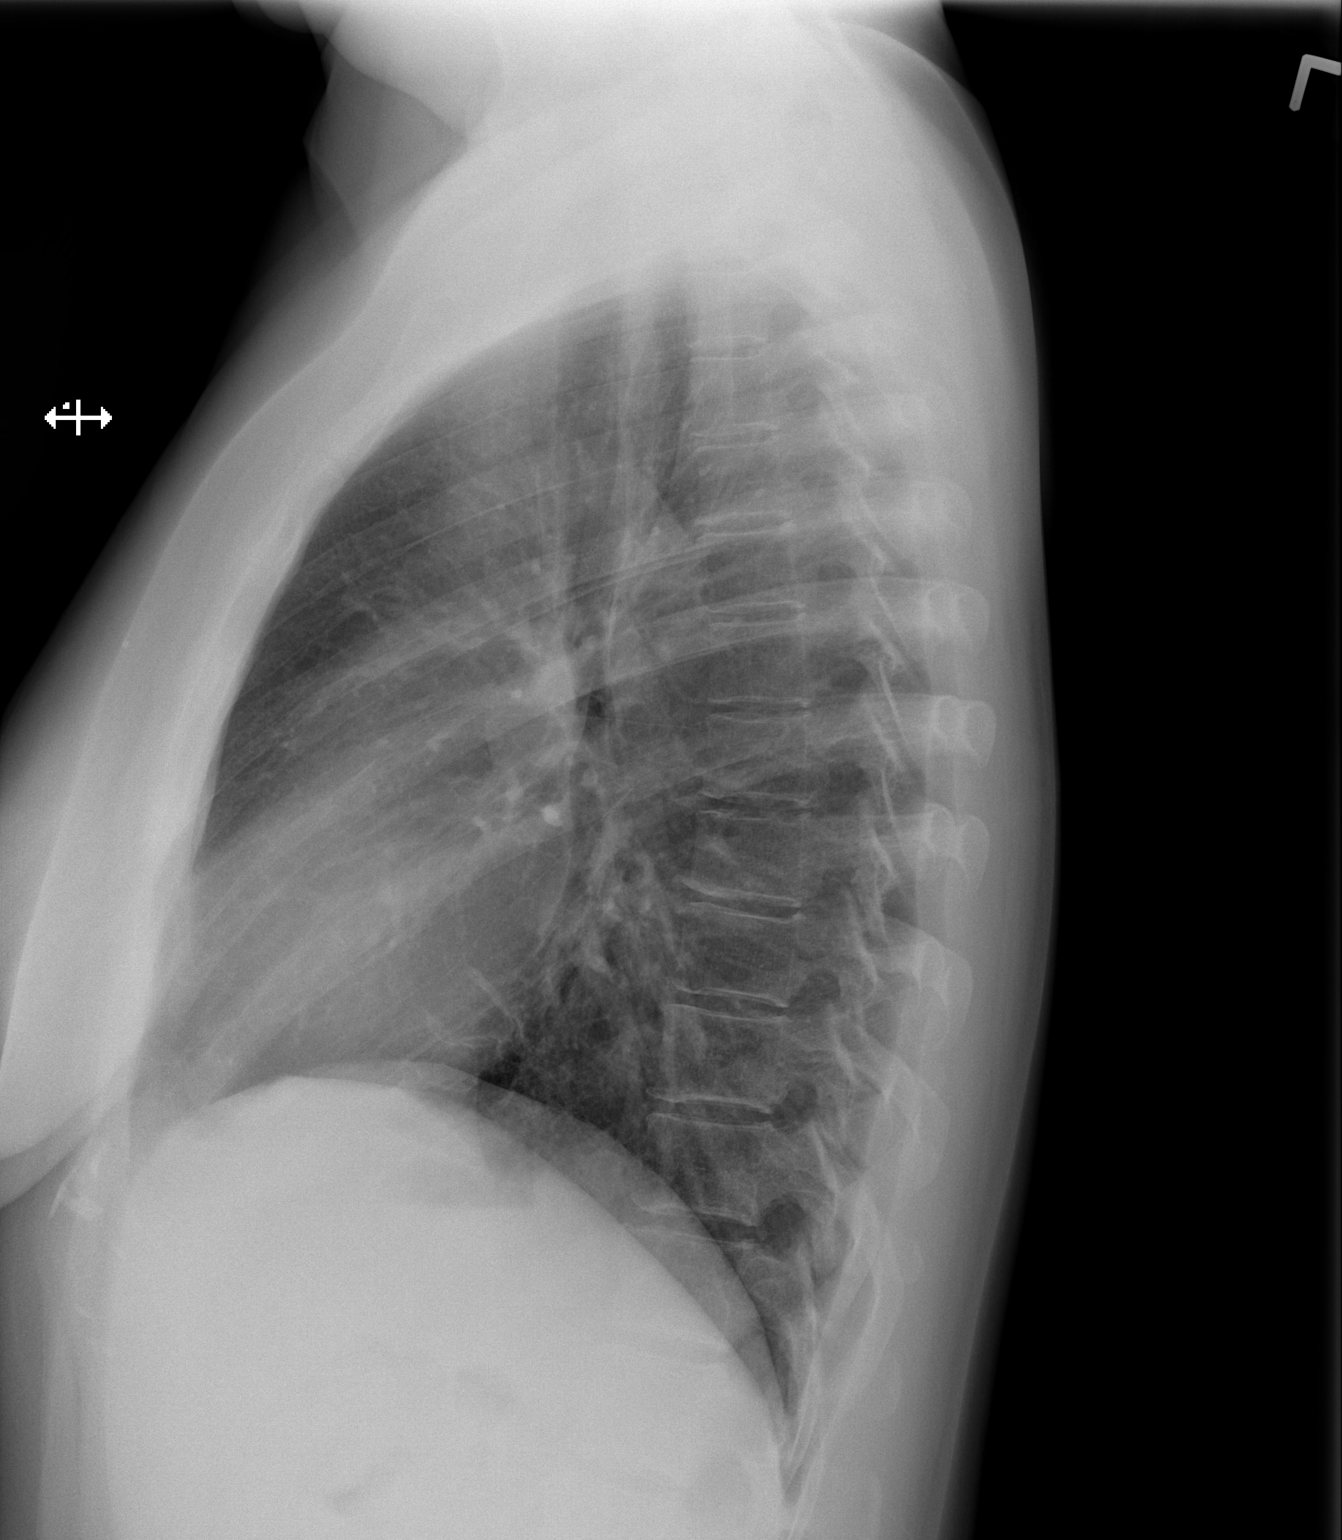

[2 of 2 positions shown; findings below may reference images not displayed]

FINDINGS: The lungs are clear without focal pneumonia, edema, pneumothorax or
pleural effusion. The cardiopericardial silhouette is within normal
limits for size. The visualized bony structures of the thorax are
intact.
IMPRESSION: Normal exam.

## 2020-12-02 ENCOUNTER — Other Ambulatory Visit: Payer: Self-pay

## 2020-12-02 ENCOUNTER — Ambulatory Visit
Admission: EM | Admit: 2020-12-02 | Discharge: 2020-12-02 | Disposition: A | Discharge status: Home / Self Care | Payer: Medicaid Other | Attending: Family Medicine | Admitting: Family Medicine

## 2020-12-02 ENCOUNTER — Encounter: Payer: Self-pay | Admitting: Emergency Medicine

## 2020-12-02 DIAGNOSIS — H10502 Unspecified blepharoconjunctivitis, left eye: Secondary | ICD-10-CM

## 2020-12-02 MED ORDER — POLYMYXIN B-TRIMETHOPRIM 10000-0.1 UNIT/ML-% OP SOLN: 2.0000 [drp] | Freq: Three times a day (TID) | OPHTHALMIC | 0 refills | Status: AC

## 2020-12-02 NOTE — ED Triage Notes (Signed)
Pt presents with left eye redness and pain x 2 days
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
## 2021-04-01 ENCOUNTER — Emergency Department
Admission: EM | Admit: 2021-04-01 | Discharge: 2021-04-01 | Disposition: A | Discharge status: Home / Self Care | Payer: Medicaid Other | Attending: Emergency Medicine | Admitting: Emergency Medicine

## 2021-04-01 ENCOUNTER — Other Ambulatory Visit: Payer: Self-pay

## 2021-04-01 ENCOUNTER — Emergency Department: Payer: Medicaid Other

## 2021-04-01 DIAGNOSIS — R42 Dizziness and giddiness: Secondary | ICD-10-CM

## 2021-04-01 DIAGNOSIS — I10 Essential (primary) hypertension: Secondary | ICD-10-CM | POA: Diagnosis not present

## 2021-04-01 LAB — CBC
HCT: 43.9 % (ref 36.0–46.0)
Hemoglobin: 14.9 g/dL (ref 12.0–15.0)
MCH: 34 pg (ref 26.0–34.0)
MCHC: 33.9 g/dL (ref 30.0–36.0)
MCV: 100.2 fL — ABNORMAL HIGH (ref 80.0–100.0)
Platelets: 371 10*3/uL (ref 150–400)
RBC: 4.38 MIL/uL (ref 3.87–5.11)
RDW: 13.2 % (ref 11.5–15.5)
WBC: 10.2 10*3/uL (ref 4.0–10.5)
nRBC: 0 % (ref 0.0–0.2)

## 2021-04-01 LAB — BASIC METABOLIC PANEL
Anion gap: 11 (ref 5–15)
BUN: 13 mg/dL (ref 6–20)
CO2: 24 mmol/L (ref 22–32)
Calcium: 9.8 mg/dL (ref 8.9–10.3)
Chloride: 101 mmol/L (ref 98–111)
Creatinine, Ser: 0.55 mg/dL (ref 0.44–1.00)
GFR, Estimated: >60 mL/min (ref >60)
Glucose, Bld: 104 mg/dL — ABNORMAL HIGH (ref 70–99)
Potassium: 3.5 mmol/L (ref 3.5–5.1)
Sodium: 136 mmol/L (ref 135–145)

## 2021-04-01 LAB — TSH: TSH: 1.908 u[IU]/mL (ref 0.350–4.500)

## 2021-04-01 LAB — POC URINE PREG, ED: Preg Test, Ur: NEGATIVE

## 2021-04-01 LAB — TROPONIN I (HIGH SENSITIVITY)
Troponin I (High Sensitivity): 4 ng/L (ref <18)
Troponin I (High Sensitivity): 4 ng/L (ref <18)

## 2021-04-01 MED ORDER — MECLIZINE HCL 25 MG PO TABS
25.0000 mg | ORAL_TABLET | Freq: Once | ORAL | Status: AC
  Administered 2021-04-01: 25 mg via ORAL
  Filled 2021-04-01: qty 1

## 2021-04-01 MED ORDER — MECLIZINE HCL 25 MG PO TABS: 25.0000 mg | ORAL_TABLET | Freq: Three times a day (TID) | ORAL | 0 refills | Status: DC | PRN

## 2021-04-01 MED ORDER — HYDRALAZINE HCL 20 MG/ML IJ SOLN
10.0000 mg | Freq: Once | INTRAMUSCULAR | Status: AC
  Administered 2021-04-01: 10 mg via INTRAVENOUS
  Filled 2021-04-01: qty 1

## 2021-04-01 MED ORDER — METOPROLOL TARTRATE 25 MG PO TABS: 12.5000 mg | ORAL_TABLET | Freq: Two times a day (BID) | ORAL | 1 refills | Status: DC

## 2021-04-01 NOTE — ED Notes (Signed)
Pt undressed and placed on the cardiac monitor ?

## 2021-04-01 NOTE — ED Triage Notes (Signed)
Pt comes with c/o near syncopal episode, dizziness, room spinning and HTN. Pt states it hasn't gotten better and she just doesn't feel good.  ? ?Pt states some Cp between breasts about hour ago. ?

## 2021-04-01 NOTE — Discharge Instructions (Signed)
Please seek medical attention for any high fevers, chest pain, shortness of breath, change in behavior, persistent vomiting, bloody stool or any other new or concerning symptoms.  

## 2021-04-01 NOTE — ED Notes (Signed)
Urine sent to lab 

## 2021-04-01 NOTE — ED Provider Notes (Signed)
? ?[facility] ?Provider Note ? ? ? Event Date/Time  ? First MD Initiated Contact with Patient 04/01/21 1625   ?  (approximate) ? ? ?History  ? ?Near Syncope ? ? ?HPI ? ?Catherine [name] is a 36 y.o. female  who, per urgent care note dated 12/02/20 has history of migraines and GERD, who presents to the emergency department today because of concern for dizziness and elevated blood pressure.  Patient states that she noticed the symptoms for the first time this morning.  She did feel dizzy and felt like she might pass out.  This would occur when she would change positions quickly or try to bend over to tie her shoes.  Her husband has high blood pressure so she checked her blood pressure and found it to be elevated.  She denies any history of high blood pressure although states she does not have a doctor so does not get it checked regularly.  She does state that she has history of fast heart rate. Patient did have some chest discomfort today as well.  She denies any recent illness or fevers. ? ?  ? ? ?Physical Exam  ? ?Triage Vital Signs: ?ED Triage Vitals  ?Enc Vitals Group  ?   BP 04/01/21 1622 (!) 197/120  ?   Pulse Rate 04/01/21 1622 (!) 107  ?   Resp 04/01/21 1622 18  ?   Temp 04/01/21 1622 97.8 ?F (36.6 ?C)  ?   Temp src --   ?   SpO2 04/01/21 1622 100 %  ?   Weight --   ?   Height --   ?   Head Circumference --   ?   Peak Flow --   ?   Pain Score 04/01/21 1619 2  ?   Pain Loc --   ?   Pain Edu? --   ?   Excl. in GC? --   ? ? ?Most recent vital signs: ?Vitals:  ? 04/01/21 1622 04/01/21 1632  ?BP: (!) 197/120 (!) 195/111  ?Pulse: (!) 107 97  ?Resp: 18 16  ?Temp: 97.8 ?F (36.6 ?C)   ?SpO2: 100% 100%  ? ? ?General: Awake, no distress.  ?CV:  Good peripheral perfusion. Tachycardia, regular rhythm. No m/r/g. ?Resp:  Normal effort. Clear to auscultation. ?Abd:  No distention.  ? ? ?ED Results / Procedures / Treatments  ? ?Labs ?(all labs ordered are listed, but only abnormal results are  displayed) ?Labs Reviewed  ?CBC - Abnormal; Notable for the following components:  ?    Result Value  ? MCV 100.2 (*)   ? All other components within normal limits  ?BASIC METABOLIC PANEL  ?POC URINE PREG, ED  ?TROPONIN I (HIGH SENSITIVITY)  ? ? ? ?EKG ? ?IPhineas Semen, attending physician, personally viewed and interpreted this EKG ? ?EKG Time: 1619 ?Rate: 108 ?Rhythm: sinus tachycardia ?Axis: normal ?Intervals: qtc 450 ?QRS: narrow ?ST changes: no st elevation ?Impression: abnormal ekg ? ?RADIOLOGY ?I independently interpreted and visualized the CXR. My interpretation: No pneumonia. No pneumothorax. ?Radiology interpretation:  ?IMPRESSION:  ?No active cardiopulmonary disease.  ? ? ?PROCEDURES: ? ?Critical Care performed: No ? ?Procedures ? ? ?MEDICATIONS ORDERED IN ED: ?Medications - No data to display ? ? ?IMPRESSION / MDM / ASSESSMENT AND PLAN / ED COURSE  ?I reviewed the triage vital signs and the nursing notes. ?             ?               ? ?  Differential diagnosis includes, but is not limited to, pneumonia, pneumothorax, ACS, thyroid issues. ? ?Patient presented to the emergency department today because of concerns for dizziness and high blood pressure.  Patient's blood pressure was quite elevated here in the emergency department.  She states she does not carry diagnosis of high blood pressure but does not follow-up with doctors regularly.  She does states she has a history of fast heart rate and she is slightly tachycardic here.  This time I however have low concern for PE given lack of shortness of breath or hypoxia or tachypnea.  The patient was given medication here and her blood pressure did improve.  Additionally she was given medication for possible vertigo.  She states her dizziness did feel better.  Troponin was negative x2.  This time given negative troponin, chest x-ray think is reasonable for patient to be discharged.  We did discuss strict return precautions.  Will discharge with  prescription for blood pressure medication as well as meclizine.  Will give patient primary care follow-up information. ? ? ? ? ?FINAL CLINICAL IMPRESSION(S) / ED DIAGNOSES  ? ?Final diagnoses:  ?Hypertension, unspecified type  ?Dizziness  ? ? ?Note:  This document was prepared using Dragon voice recognition software and may include unintentional dictation errors. ? ?  ?Phineas Semen, MD ?04/01/21 1943 ? ?

## 2021-05-11 ENCOUNTER — Ambulatory Visit: Payer: Medicaid Other | Admitting: Nurse Practitioner

## 2021-05-11 ENCOUNTER — Encounter: Payer: Self-pay | Admitting: Nurse Practitioner

## 2021-05-11 VITALS — BP 160/104 | HR 89 | Temp 97.5°F | Resp 14 | Ht 61.75 in | Wt 147.2 lb

## 2021-05-11 DIAGNOSIS — F419 Anxiety disorder, unspecified: Secondary | ICD-10-CM

## 2021-05-11 DIAGNOSIS — R0789 Other chest pain: Secondary | ICD-10-CM | POA: Diagnosis not present

## 2021-05-11 DIAGNOSIS — I10 Essential (primary) hypertension: Secondary | ICD-10-CM

## 2021-05-11 DIAGNOSIS — Z720 Tobacco use: Secondary | ICD-10-CM

## 2021-05-11 DIAGNOSIS — F32A Depression, unspecified: Secondary | ICD-10-CM

## 2021-05-11 MED ORDER — SERTRALINE HCL 25 MG PO TABS: 25.0000 mg | ORAL_TABLET | Freq: Every day | ORAL | 1 refills | Status: DC

## 2021-05-11 MED ORDER — OLMESARTAN MEDOXOMIL 20 MG PO TABS: 20.0000 mg | ORAL_TABLET | Freq: Every day | ORAL | 1 refills | Status: DC

## 2021-05-11 NOTE — Progress Notes (Signed)
? ?New Patient Office Visit ? ?Subjective   ? ?Patient ID: Catherine [name], female    DOB: 05/11/1985  Age: 36 y.o. MRN: [id number] ? ?CC:  ?Chief Complaint  ?Patient presents with  ? Establish Care  ?  Previous Dr Dayton Martes with [facility]  ? Hypertension  ?  Follow up after ER visit.  ? ? ?HPI ?Catherine [name] presents to establish care ? ?HTN: has been taking the medication as prescribed. States that she feels weird. But no dizziness or symptoms that took her to the ED. States that she can feel tightness and when her heart beats fast.  She has been taking her blood pressure at home almost 2 and obsessive point of up to 5-6 times a day.  Patient states her sister is a Engineer, civil (consulting) and told her she is checking it too often I agree with this assessment. ? ?Eating: between all day or nothing at all. States that she drinks a lot of sodas.  Patient does not have a current exercise regimen.  Did encourage 30 minutes a day 5 times a week.  She can start slowly with 1 or 2 days at a time ? ?Depression and anxiety: has been on Wellbutrin, klonopin, adderall, paxil in the past.  She has been hospitalized in the past states that she was diagnosed with bipolar and put on a medication that began with a L was discontinued and switched to Paxil. ? ?Patient is an appoint with OB/GYN June 6 to get caught up on her annual exams ? ? ? ?Outpatient Encounter Medications as of 05/11/2021  ?Medication Sig  ? metoprolol tartrate (LOPRESSOR) 25 MG tablet Take 0.5 tablets (12.5 mg total) by mouth 2 (two) times daily.  ? olmesartan (BENICAR) 20 MG tablet Take 1 tablet (20 mg total) by mouth daily.  ? sertraline (ZOLOFT) 25 MG tablet Take 1 tablet (25 mg total) by mouth daily.  ? meclizine (ANTIVERT) 25 MG tablet Take 1 tablet (25 mg total) by mouth 3 (three) times daily as needed for dizziness. (Patient not taking: Reported on 05/11/2021)  ? [DISCONTINUED] acetaminophen (TYLENOL) 500 MG tablet Take 1,000 mg by mouth every 6 (six) hours as needed for  fever or headache (pain).  ? [DISCONTINUED] cholecalciferol (VITAMIN D) 1000 units tablet Take 1 tablet (1,000 Units total) by mouth daily.  ? ?No facility-administered encounter medications on file as of 05/11/2021.  ? ? ?Past Medical History:  ?Diagnosis Date  ? Anemia   ? Anxiety   ? Depression   ? GERD (gastroesophageal reflux disease)   ? History of colon polyps   ? History of depression   ? History of gastroesophageal reflux (GERD)   ? History of headache   ? History of migraine   ? Hypertension   ? ? ?Past Surgical History:  ?Procedure Laterality Date  ? NO PAST SURGERIES    ? ? ?Family History  ?Problem Relation Age of Onset  ? Heart attack Mother   ?     x 2  ? Lung cancer Mother   ? Heart attack Father   ? Hypertension Sister   ? Hypertension Sister   ? Heart disease Maternal Grandmother   ? Depression Maternal Grandfather   ? Diabetes Maternal Grandfather   ? Lung cancer Paternal Grandmother   ? Lung cancer Paternal Grandfather   ? ? ?Social History  ? ?Socioeconomic History  ? Marital status: Married  ?  Spouse name: Not on file  ? Number of children: 2  ?  Years of education: Not on file  ? Highest education level: High school graduate  ?Occupational History  ? Not on file  ?Tobacco Use  ? Smoking status: Every Day  ?  Packs/day: 1.00  ?  Years: 22.00  ?  Pack years: 22.00  ?  Types: Cigarettes  ? Smokeless tobacco: Never  ?Vaping Use  ? Vaping Use: Never used  ?Substance and Sexual Activity  ? Alcohol use: Yes  ?  Comment: 1 drink every day liquor  ? Drug use: No  ? Sexual activity: Yes  ?Other Topics Concern  ? Not on file  ?Social History Narrative  ? 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 ?Social Determinants of Health  ? ?Financial Resource Strain: Not on file  ?Food Insecurity: Not on file  ?Transportation Needs: Not on file  ?Physical Activity: Not on file  ?Stress: Not on file  ?Social Connections: Not on file  ?Intimate Partner Violence: Not on file  ? ? ?Review of Systems  ?Constitutional:  Positive for  malaise/fatigue. Negative for chills and fever.  ?Respiratory:  Positive for shortness of breath (intermittent with rest and doe).   ?Cardiovascular:  Positive for chest pain. Negative for palpitations.  ?Gastrointestinal:  Positive for abdominal pain and diarrhea. Negative for constipation, nausea and vomiting.  ?     BM daily 7 times a day ?  ?Genitourinary:  Negative for dysuria and hematuria.  ?Neurological:  Negative for dizziness and headaches.  ?Psychiatric/Behavioral:  Negative for hallucinations and suicidal ideas.   ? ?  ? ? ?Objective   ? ?BP (!) 160/104   Pulse 89   Temp (!) 97.5 ?F (36.4 ?C)   Resp 14   Ht 5' 1.75" (1.568 m)   Wt 147 lb 4 oz (66.8 kg)   LMP 04/21/2021   SpO2 98%   BMI 27.15 kg/m?  ? ?Physical Exam ?Vitals and nursing note reviewed.  ?Constitutional:   ?   Appearance: Normal appearance.  ?HENT:  ?   Right Ear: Tympanic membrane, ear canal and external ear normal.  ?   Left Ear: Tympanic membrane, ear canal and external ear normal.  ?   Mouth/Throat:  ?   Mouth: Mucous membranes are moist.  ?   Pharynx: Oropharynx is clear.  ?Eyes:  ?   Extraocular Movements: Extraocular movements intact.  ?   Pupils: Pupils are equal, round, and reactive to light.  ?Cardiovascular:  ?   Rate and Rhythm: Normal rate and regular rhythm.  ?   Pulses: Normal pulses.  ?   Heart sounds: Normal heart sounds.  ?Pulmonary:  ?   Effort: Pulmonary effort is normal.  ?   Breath sounds: Normal breath sounds.  ?Abdominal:  ?   General: Bowel sounds are normal. There is no distension.  ?   Palpations: There is no mass.  ?   Tenderness: There is no abdominal tenderness.  ?   Hernia: No hernia is present.  ?Musculoskeletal:  ?   Right lower leg: No edema.  ?   Left lower leg: No edema.  ?Lymphadenopathy:  ?   Cervical: No cervical adenopathy.  ?Skin: ?   General: Skin is warm.  ?Neurological:  ?   General: No focal deficit present.  ?   Mental Status: She is alert.  ?   Deep Tendon Reflexes:  ?   Reflex Scores: ?      Bicep reflexes are 2+ on the right side and 2+ on the left side. ?  Patellar reflexes are 2+ on the right side and 2+ on the left side. ?   Comments: Bilateral upper and lower extremity strength 5/5  ?Psychiatric:     ?   Mood and Affect: Mood normal.     ?   Behavior: Behavior normal.     ?   Thought Content: Thought content normal.     ?   Judgment: Judgment normal.  ? ? ? ?  ? ?Assessment & Plan:  ? ?Problem List Items Addressed This Visit   ? ?  ? Cardiovascular and Mediastinum  ? Primary hypertension  ?  Was placed on metoprolol 12.5 mg twice daily from emergency department.  Patient was tachycardic while in the emergency department.  Patient also hypertensive.  Patient taking medication as prescribed still above goal but heart rate 89 when vitals were taken we will not continue to increase metoprolol as not but best agent for blood pressure control.  We will start her on olmesartan 20 mg daily.  Did review that this is not safe during pregnancy if she plans to become pregnant patient should be she does not. ? ?  ?  ? Relevant Medications  ? olmesartan (BENICAR) 20 MG tablet  ? Other Relevant Orders  ? EKG 12-Lead (Completed)  ?  ? Other  ? Tobacco abuse  ?  Patient currently everyday smoker.  We did discuss the risks of cardiovascular events and negative effects smoking has on her body.  She also has a history strong family history of hypertension and lung cancer we will continue to have discussions in regards to smoking cessation.  We will ? ?  ?  ? Anxiety and depression - Primary  ?  PHQ-9 and GAD-7 administered in office.  Patient does have history of depression and anxiety.  Has been medicated in the past with Paxil, Wellbutrin, and Klonopin.  We will start her on SSRI we will start Zoloft 25 mg nightly.  Common side effects of medication reviewed including increasing thoughts of SI/HI. ? ?  ?  ? Relevant Medications  ? sertraline (ZOLOFT) 25 MG tablet  ? Chest tightness  ?  Likely secondary to  anxiety.  Did do EKG in office which was normal.  We compared to previous EKG patient also had a chest x-ray performed in the emergency department that was within normal limits ? ?  ?  ? Relevant Orders  ? EKG 12-Le

## 2021-05-11 NOTE — Assessment & Plan Note (Signed)
Was placed on metoprolol 12.5 mg twice daily from emergency department.  Patient was tachycardic while in the emergency department.  Patient also hypertensive.  Patient taking medication as prescribed still above goal but heart rate 89 when vitals were taken we will not continue to increase metoprolol as not but best agent for blood pressure control.  We will start her on olmesartan 20 mg daily.  Did review that this is not safe during pregnancy if she plans to become pregnant patient should be she does not. ?

## 2021-05-11 NOTE — Assessment & Plan Note (Signed)
Patient currently everyday smoker.  We did discuss the risks of cardiovascular events and negative effects smoking has on her body.  She also has a history strong family history of hypertension and lung cancer we will continue to have discussions in regards to smoking cessation.  We will ?

## 2021-05-11 NOTE — Assessment & Plan Note (Signed)
PHQ-9 and GAD-7 administered in office.  Patient does have history of depression and anxiety.  Has been medicated in the past with Paxil, Wellbutrin, and Klonopin.  We will start her on SSRI we will start Zoloft 25 mg nightly.  Common side effects of medication reviewed including increasing thoughts of SI/HI. ?

## 2021-05-11 NOTE — Assessment & Plan Note (Signed)
Likely secondary to anxiety.  Did do EKG in office which was normal.  We compared to previous EKG patient also had a chest x-ray performed in the emergency department that was within normal limits ?

## 2021-05-11 NOTE — Patient Instructions (Signed)
Nice to see you today ?I sent in the blood pressure medication and the zoloft ?I want to see you in 1 month for a recheck on blood pressure ?I want you to check your blood pressure once a day for the next week, record it for me ?Then check it approx 4 times a week there after, record it also. Either send it to me via my chart or bring it to our next office visit ? ?

## 2021-06-08 ENCOUNTER — Ambulatory Visit: Payer: BC Managed Care – PPO | Admitting: Nurse Practitioner

## 2021-06-09 ENCOUNTER — Encounter: Payer: Self-pay | Admitting: Obstetrics and Gynecology

## 2021-06-09 ENCOUNTER — Other Ambulatory Visit (HOSPITAL_COMMUNITY)
Admission: RE | Admit: 2021-06-09 | Discharge: 2021-06-09 | Disposition: A | Discharge status: Home / Self Care | Payer: BC Managed Care – PPO | Source: Ambulatory Visit | Attending: Obstetrics and Gynecology | Admitting: Obstetrics and Gynecology

## 2021-06-09 ENCOUNTER — Ambulatory Visit: Payer: Medicaid Other | Admitting: Obstetrics and Gynecology

## 2021-06-09 VITALS — BP 147/98 | HR 93 | Ht 62.0 in | Wt 147.0 lb

## 2021-06-09 DIAGNOSIS — N760 Acute vaginitis: Secondary | ICD-10-CM

## 2021-06-09 DIAGNOSIS — N946 Dysmenorrhea, unspecified: Secondary | ICD-10-CM

## 2021-06-09 DIAGNOSIS — N920 Excessive and frequent menstruation with regular cycle: Secondary | ICD-10-CM

## 2021-06-09 DIAGNOSIS — Z202 Contact with and (suspected) exposure to infections with a predominantly sexual mode of transmission: Secondary | ICD-10-CM

## 2021-06-09 DIAGNOSIS — Z01419 Encounter for gynecological examination (general) (routine) without abnormal findings: Secondary | ICD-10-CM

## 2021-06-09 DIAGNOSIS — N643 Galactorrhea not associated with childbirth: Secondary | ICD-10-CM

## 2021-06-09 LAB — CBC
Hematocrit: 42.9 % (ref 34.0–46.6)
Hemoglobin: 14.9 g/dL (ref 11.1–15.9)
MCH: 34.9 pg — ABNORMAL HIGH (ref 26.6–33.0)
MCHC: 34.7 g/dL (ref 31.5–35.7)
MCV: 101 fL — ABNORMAL HIGH (ref 79–97)
Platelets: 506 10*3/uL — ABNORMAL HIGH (ref 150–450)
RBC: 4.27 x10E6/uL (ref 3.77–5.28)
RDW: 14.1 % (ref 11.7–15.4)
WBC: 11.4 10*3/uL — ABNORMAL HIGH (ref 3.4–10.8)

## 2021-06-09 MED ORDER — SLYND 4 MG PO TABS: 1.0000 | ORAL_TABLET | Freq: Every day | ORAL | 3 refills | Status: DC

## 2021-06-09 NOTE — Progress Notes (Signed)
Patient presents for Annual Exam.  LMP: 06/02/21 last 3-6 days with heavy flow Last pap:12/08/2016 Family Hx of Breast Cancer: None  Pt notes Mother had Hysterectomy hx of heavy periods. STD Screening: Full panel    CC: Irregular/heavy periods  Hot flashes having to carry a fan around Discharge from breast that is white and thick ,not painful touch pt states this has been a issue x 5 yrs. Recurrent yeast infections after cycles.
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
## 2021-06-10 LAB — PROLACTIN: Prolactin: 11.4 ng/mL (ref 4.8–23.3)

## 2021-06-10 LAB — HIV ANTIBODY (ROUTINE TESTING W REFLEX): HIV Screen 4th Generation wRfx: NONREACTIVE

## 2021-06-10 LAB — HEPATITIS C ANTIBODY: Hep C Virus Ab: NONREACTIVE

## 2021-06-10 LAB — TSH: TSH: 1.9 u[IU]/mL (ref 0.450–4.500)

## 2021-06-10 LAB — HEPATITIS B SURFACE ANTIGEN: Hepatitis B Surface Ag: NEGATIVE

## 2021-06-10 LAB — RPR: RPR Ser Ql: NONREACTIVE

## 2021-06-10 LAB — BETA HCG QUANT (REF LAB): hCG Quant: <1 m[IU]/mL

## 2021-06-13 LAB — CERVICOVAGINAL ANCILLARY ONLY
Bacterial Vaginitis (gardnerella): POSITIVE — AB
Candida Glabrata: NEGATIVE
Candida Vaginitis: NEGATIVE
Chlamydia: NEGATIVE
Comment: NEGATIVE
Comment: NEGATIVE
Comment: NEGATIVE
Comment: NEGATIVE
Comment: NEGATIVE
Comment: NORMAL
Neisseria Gonorrhea: NEGATIVE
Trichomonas: NEGATIVE

## 2021-06-13 LAB — CYTOLOGY - PAP
Comment: NEGATIVE
Diagnosis: UNDETERMINED — AB
High risk HPV: NEGATIVE

## 2021-06-24 ENCOUNTER — Encounter: Payer: Self-pay | Admitting: Obstetrics and Gynecology

## 2021-06-28 ENCOUNTER — Ambulatory Visit
Admission: RE | Admit: 2021-06-28 | Discharge: 2021-06-28 | Disposition: A | Discharge status: Home / Self Care | Payer: BC Managed Care – PPO | Source: Ambulatory Visit | Attending: Obstetrics and Gynecology | Admitting: Obstetrics and Gynecology

## 2021-06-28 DIAGNOSIS — Z01419 Encounter for gynecological examination (general) (routine) without abnormal findings: Secondary | ICD-10-CM

## 2021-06-28 DIAGNOSIS — N643 Galactorrhea not associated with childbirth: Secondary | ICD-10-CM

## 2021-06-28 DIAGNOSIS — R922 Inconclusive mammogram: Secondary | ICD-10-CM | POA: Diagnosis not present

## 2021-06-28 DIAGNOSIS — N6452 Nipple discharge: Secondary | ICD-10-CM | POA: Diagnosis not present

## 2021-07-04 ENCOUNTER — Other Ambulatory Visit: Payer: Self-pay

## 2021-07-04 DIAGNOSIS — B9689 Other specified bacterial agents as the cause of diseases classified elsewhere: Secondary | ICD-10-CM

## 2021-07-04 MED ORDER — METRONIDAZOLE 500 MG PO TABS: 500.0000 mg | ORAL_TABLET | Freq: Two times a day (BID) | ORAL | 0 refills | Status: AC

## 2021-07-07 ENCOUNTER — Encounter: Payer: Self-pay | Admitting: Obstetrics and Gynecology

## 2021-07-08 ENCOUNTER — Other Ambulatory Visit: Payer: Self-pay

## 2021-07-08 ENCOUNTER — Other Ambulatory Visit: Payer: Self-pay | Admitting: Obstetrics and Gynecology

## 2021-07-08 DIAGNOSIS — R928 Other abnormal and inconclusive findings on diagnostic imaging of breast: Secondary | ICD-10-CM

## 2021-07-08 NOTE — Progress Notes (Signed)
Breast Bx order placed today ok per provider to place order so that pt may schedule appt.

## 2021-07-18 ENCOUNTER — Other Ambulatory Visit: Payer: Self-pay | Admitting: Nurse Practitioner

## 2021-07-18 ENCOUNTER — Encounter: Payer: Self-pay | Admitting: Nurse Practitioner

## 2021-07-18 DIAGNOSIS — I10 Essential (primary) hypertension: Secondary | ICD-10-CM

## 2021-07-18 DIAGNOSIS — F419 Anxiety disorder, unspecified: Secondary | ICD-10-CM

## 2021-07-18 MED ORDER — OLMESARTAN MEDOXOMIL 20 MG PO TABS: 20.0000 mg | ORAL_TABLET | Freq: Every day | ORAL | 0 refills | Status: DC

## 2021-07-18 MED ORDER — SERTRALINE HCL 25 MG PO TABS: 25.0000 mg | ORAL_TABLET | Freq: Every day | ORAL | 0 refills | Status: DC

## 2021-07-25 ENCOUNTER — Ambulatory Visit (INDEPENDENT_AMBULATORY_CARE_PROVIDER_SITE_OTHER): Payer: BC Managed Care – PPO | Admitting: Nurse Practitioner

## 2021-07-25 VITALS — BP 118/82 | HR 88 | Temp 96.9°F | Resp 12 | Ht 62.0 in | Wt 148.4 lb

## 2021-07-25 DIAGNOSIS — F419 Anxiety disorder, unspecified: Secondary | ICD-10-CM

## 2021-07-25 DIAGNOSIS — I10 Essential (primary) hypertension: Secondary | ICD-10-CM | POA: Diagnosis not present

## 2021-07-25 DIAGNOSIS — R7989 Other specified abnormal findings of blood chemistry: Secondary | ICD-10-CM | POA: Diagnosis not present

## 2021-07-25 DIAGNOSIS — F32A Depression, unspecified: Secondary | ICD-10-CM | POA: Diagnosis not present

## 2021-07-25 MED ORDER — SERTRALINE HCL 50 MG PO TABS: 50.0000 mg | ORAL_TABLET | Freq: Every day | ORAL | 1 refills | Status: DC

## 2021-07-25 MED ORDER — METOPROLOL SUCCINATE ER 25 MG PO TB24: 25.0000 mg | ORAL_TABLET | Freq: Every day | ORAL | 1 refills | Status: DC

## 2021-07-25 MED ORDER — OLMESARTAN MEDOXOMIL 20 MG PO TABS: 20.0000 mg | ORAL_TABLET | Freq: Every day | ORAL | 1 refills | Status: DC

## 2021-07-25 NOTE — Assessment & Plan Note (Signed)
Was started on sertraline 25 mg.  Tolerating medication well slight improvement in scores with PHQ-9 GAD-7.  We will titrate up to 50 mg.  Denies HI/SI/AVH.

## 2021-07-25 NOTE — Assessment & Plan Note (Signed)
Patient's blood pressure normotensive in office.  She is doing well.  She has been off Lopressor for approximately 1 week.  Did stress my concern that at home are too low with the medications.  Patient states she felt better when she had Lopressor on board she has trouble remembering to take twice a day she takes 1 pill once a day.  We will change from metoprolol  tartrate to metoprolol succinate patient made aware of change.  Continue checking blood pressure at home and report back any adverse drug events or symptoms.

## 2021-07-25 NOTE — Progress Notes (Signed)
Established Patient Office Visit  Subjective   Patient ID: Catherine [name], female    DOB: [birth year]  Age: 36 y.o. MRN: [id number]  Chief Complaint  Patient presents with   Hypertension    Follow up   Depression    Follow up-better     HTN: States that she can check her blood pressure at home and it was around 140. Has been out of lopressor for approx 1 week. States that she feels better with the lopressor on board.  Blood pressure within normal limits today being off of Lopressor.  Anxiety and depression: Patient was started on sertraline 25 mg.  States she is tolerating the medication well.  States she has had some improvement in her symptoms.  NO SI/AVH/HI.  She is little anxious she admits that she is going to have a breast biopsy coming up.  And also looked at her blood work through Dr. Vergie Living was concerned with her CBC.     05/11/2021    3:23 PM 10/01/2017    9:26 AM  PHQ9 SCORE ONLY  PHQ-9 Total Score 14 14       05/11/2021    3:24 PM  GAD 7 : Generalized Anxiety Score  Nervous, Anxious, on Edge 3  Control/stop worrying 3  Worry too much - different things 3  Trouble relaxing 3  Restless 3  Easily annoyed or irritable 3  Afraid - awful might happen 0  Total GAD 7 Score 18  Anxiety Difficulty Extremely difficult        Review of Systems  Constitutional:  Negative for chills and fever.  Respiratory:  Negative for shortness of breath.   Cardiovascular:  Negative for chest pain and leg swelling.  Neurological:  Negative for dizziness and headaches.  Psychiatric/Behavioral:  Positive for depression. Negative for hallucinations and suicidal ideas.       Objective:     BP 118/82   Pulse 88   Temp (!) 96.9 F (36.1 C) (Temporal)   Resp 12   Ht 5\' 2"  (1.575 m)   Wt 148 lb 6 oz (67.3 kg)   SpO2 99%   BMI 27.14 kg/m    Physical Exam Vitals and nursing note reviewed.  Constitutional:      Appearance: Normal appearance.  Cardiovascular:     Rate  and Rhythm: Normal rate and regular rhythm.     Heart sounds: Normal heart sounds.  Pulmonary:     Effort: Pulmonary effort is normal.     Breath sounds: Normal breath sounds.  Musculoskeletal:     Right lower leg: No edema.     Left lower leg: No edema.  Neurological:     Mental Status: She is alert.  Psychiatric:        Mood and Affect: Mood normal.        Behavior: Behavior normal.        Thought Content: Thought content normal.        Judgment: Judgment normal.      No results found for any visits on 07/25/21.    The ASCVD Risk score (Arnett DK, et al., 2019) failed to calculate for the following reasons:   The 2019 ASCVD risk score is only valid for ages 34 to 11    Assessment & Plan:   Problem List Items Addressed This Visit       Cardiovascular and Mediastinum   Primary hypertension    Patient's blood pressure normotensive in office.  She is doing well.  She has been off Lopressor for approximately 1 week.  Did stress my concern that at home are too low with the medications.  Patient states she felt better when she had Lopressor on board she has trouble remembering to take twice a day she takes 1 pill once a day.  We will change from metoprolol  tartrate to metoprolol succinate patient made aware of change.  Continue checking blood pressure at home and report back any adverse drug events or symptoms.      Relevant Medications   metoprolol succinate (TOPROL-XL) 25 MG 24 hr tablet   olmesartan (BENICAR) 20 MG tablet   Other Relevant Orders   Comprehensive metabolic panel   CBC     Other   Anxiety and depression    Was started on sertraline 25 mg.  Tolerating medication well slight improvement in scores with PHQ-9 GAD-7.  We will titrate up to 50 mg.  Denies HI/SI/AVH.      Relevant Medications   sertraline (ZOLOFT) 50 MG tablet   Abnormal CBC - Primary    Patient is concerned for abnormal CBC done by Dr. [place] Bing.  Will redraw and do anemia panel.   Pending results      Relevant Orders   CBC   Vitamin B12   IBC + Ferritin   Folate    Return in about 6 months (around 01/25/2022) for Recheck of chronic conditions.    Audria Nine, NP

## 2021-07-25 NOTE — Patient Instructions (Signed)
Nice to see you today I will be in touch with the labs once I have the results Follow up with me in 6 months for a recheck, if you need me sooner let me know You can take 2 of the 25mg  tablets and when finished pick up the new prescription  If you feel like the medication is not doing well let me know

## 2021-07-25 NOTE — Assessment & Plan Note (Signed)
Patient is concerned for abnormal CBC done by Dr. [place] Bing.  Will redraw and do anemia panel.  Pending results

## 2021-07-26 LAB — IBC + FERRITIN
Ferritin: 22.9 ng/mL (ref 10.0–291.0)
Iron: 59 ug/dL (ref 42–145)
Saturation Ratios: 11.7 % — ABNORMAL LOW (ref 20.0–50.0)
TIBC: 504 ug/dL — ABNORMAL HIGH (ref 250.0–450.0)
Transferrin: 360 mg/dL (ref 212.0–360.0)

## 2021-07-26 LAB — COMPREHENSIVE METABOLIC PANEL
ALT: 18 U/L (ref 0–35)
AST: 18 U/L (ref 0–37)
Albumin: 4.7 g/dL (ref 3.5–5.2)
Alkaline Phosphatase: 64 U/L (ref 39–117)
BUN: 8 mg/dL (ref 6–23)
CO2: 24 mEq/L (ref 19–32)
Calcium: 9.5 mg/dL (ref 8.4–10.5)
Chloride: 102 mEq/L (ref 96–112)
Creatinine, Ser: 0.54 mg/dL (ref 0.40–1.20)
GFR: 116.99 mL/min (ref >60.00)
Glucose, Bld: 87 mg/dL (ref 70–99)
Potassium: 4.1 mEq/L (ref 3.5–5.1)
Sodium: 138 mEq/L (ref 135–145)
Total Bilirubin: 0.2 mg/dL (ref 0.2–1.2)
Total Protein: 7 g/dL (ref 6.0–8.3)

## 2021-07-26 LAB — CBC
HCT: 38.3 % (ref 36.0–46.0)
Hemoglobin: 13.1 g/dL (ref 12.0–15.0)
MCHC: 34.2 g/dL (ref 30.0–36.0)
MCV: 105.9 fl — ABNORMAL HIGH (ref 78.0–100.0)
Platelets: 443 10*3/uL — ABNORMAL HIGH (ref 150.0–400.0)
RBC: 3.62 Mil/uL — ABNORMAL LOW (ref 3.87–5.11)
RDW: 15 % (ref 11.5–15.5)
WBC: 10 10*3/uL (ref 4.0–10.5)

## 2021-07-26 LAB — FOLATE: Folate: 3.3 ng/mL — ABNORMAL LOW (ref >5.9)

## 2021-07-26 LAB — VITAMIN B12: Vitamin B-12: 93 pg/mL — ABNORMAL LOW (ref 211–911)

## 2021-07-27 ENCOUNTER — Ambulatory Visit
Admission: RE | Admit: 2021-07-27 | Discharge: 2021-07-27 | Disposition: A | Discharge status: Home / Self Care | Payer: BC Managed Care – PPO | Source: Ambulatory Visit | Attending: Obstetrics and Gynecology | Admitting: Obstetrics and Gynecology

## 2021-07-27 ENCOUNTER — Other Ambulatory Visit: Payer: Self-pay | Admitting: Nurse Practitioner

## 2021-07-27 ENCOUNTER — Other Ambulatory Visit: Payer: Self-pay | Admitting: Obstetrics and Gynecology

## 2021-07-27 DIAGNOSIS — R921 Mammographic calcification found on diagnostic imaging of breast: Secondary | ICD-10-CM

## 2021-07-27 DIAGNOSIS — R928 Other abnormal and inconclusive findings on diagnostic imaging of breast: Secondary | ICD-10-CM

## 2021-07-27 DIAGNOSIS — E538 Deficiency of other specified B group vitamins: Secondary | ICD-10-CM

## 2021-07-27 MED ORDER — FOLIC ACID 1 MG PO TABS: 1.0000 mg | ORAL_TABLET | Freq: Every day | ORAL | 0 refills | Status: DC

## 2021-07-28 ENCOUNTER — Telehealth: Payer: Self-pay | Admitting: Nurse Practitioner

## 2021-07-28 DIAGNOSIS — E538 Deficiency of other specified B group vitamins: Secondary | ICD-10-CM

## 2021-07-28 NOTE — Telephone Encounter (Signed)
Patient called back and I made her aware of message. She said she will pick up the B12 so she can take and she also scheduled labs in 1 month. I only scheduled for labs so if office visit is needed please let me know.

## 2021-07-28 NOTE — Telephone Encounter (Signed)
-----   Message from [facility], [place] sent at 07/28/2021 12:00 PM EDT ----- Patient advised. Patient prefers to do oral B12 supplement. What dose? When to come back for re check labs?

## 2021-07-28 NOTE — Telephone Encounter (Signed)
Patient can do 1,082mcg of B12 daily and we can recheck the labs in one month

## 2021-07-28 NOTE — Telephone Encounter (Signed)
I left a message for the patient to return my call.

## 2021-07-28 NOTE — Telephone Encounter (Signed)
Just lab is fine. Noted. Thank you

## 2021-09-01 ENCOUNTER — Other Ambulatory Visit: Payer: BC Managed Care – PPO

## 2021-09-15 ENCOUNTER — Ambulatory Visit (INDEPENDENT_AMBULATORY_CARE_PROVIDER_SITE_OTHER): Payer: BC Managed Care – PPO | Admitting: Obstetrics and Gynecology

## 2021-09-15 ENCOUNTER — Encounter: Payer: Self-pay | Admitting: Obstetrics and Gynecology

## 2021-09-15 VITALS — BP 119/82 | HR 103 | Wt 145.0 lb

## 2021-09-15 DIAGNOSIS — Z3041 Encounter for surveillance of contraceptive pills: Secondary | ICD-10-CM | POA: Diagnosis not present

## 2021-09-15 DIAGNOSIS — R928 Other abnormal and inconclusive findings on diagnostic imaging of breast: Secondary | ICD-10-CM | POA: Diagnosis not present

## 2021-09-15 DIAGNOSIS — R87610 Atypical squamous cells of undetermined significance on cytologic smear of cervix (ASC-US): Secondary | ICD-10-CM | POA: Diagnosis not present

## 2021-09-15 MED ORDER — SLYND 4 MG PO TABS: 1.0000 | ORAL_TABLET | Freq: Every day | ORAL | 1 refills | Status: DC
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
## 2021-09-15 NOTE — Progress Notes (Signed)
RGYN pt here to F/U.   Needs refill on BCP.

## 2021-09-23 ENCOUNTER — Other Ambulatory Visit: Payer: Self-pay | Admitting: Primary Care

## 2021-09-23 ENCOUNTER — Other Ambulatory Visit: Payer: BC Managed Care – PPO

## 2021-09-23 DIAGNOSIS — E538 Deficiency of other specified B group vitamins: Secondary | ICD-10-CM

## 2021-12-28 ENCOUNTER — Ambulatory Visit: Payer: BC Managed Care – PPO | Admitting: Nurse Practitioner

## 2022-01-05 ENCOUNTER — Encounter: Payer: Self-pay | Admitting: Obstetrics and Gynecology

## 2022-01-05 ENCOUNTER — Other Ambulatory Visit (HOSPITAL_COMMUNITY)
Admission: RE | Admit: 2022-01-05 | Discharge: 2022-01-05 | Disposition: A | Discharge status: Home / Self Care | Payer: BC Managed Care – PPO | Source: Ambulatory Visit | Attending: Obstetrics and Gynecology | Admitting: Obstetrics and Gynecology

## 2022-01-05 ENCOUNTER — Ambulatory Visit (INDEPENDENT_AMBULATORY_CARE_PROVIDER_SITE_OTHER): Payer: BC Managed Care – PPO | Admitting: Obstetrics and Gynecology

## 2022-01-05 VITALS — BP 153/109 | HR 97 | Wt 145.0 lb

## 2022-01-05 DIAGNOSIS — Z309 Encounter for contraceptive management, unspecified: Secondary | ICD-10-CM

## 2022-01-05 DIAGNOSIS — N926 Irregular menstruation, unspecified: Secondary | ICD-10-CM | POA: Insufficient documentation

## 2022-01-05 DIAGNOSIS — Z30430 Encounter for insertion of intrauterine contraceptive device: Secondary | ICD-10-CM | POA: Diagnosis not present

## 2022-01-05 DIAGNOSIS — N921 Excessive and frequent menstruation with irregular cycle: Secondary | ICD-10-CM | POA: Diagnosis not present

## 2022-01-05 DIAGNOSIS — N920 Excessive and frequent menstruation with regular cycle: Secondary | ICD-10-CM | POA: Diagnosis not present

## 2022-01-05 DIAGNOSIS — N946 Dysmenorrhea, unspecified: Secondary | ICD-10-CM

## 2022-01-05 HISTORY — PX: IUD INSERTION: OBO1003

## 2022-01-05 LAB — POCT URINE PREGNANCY: Preg Test, Ur: NEGATIVE

## 2022-01-05 MED ORDER — LEVONORGESTREL 20 MCG/DAY IU IUD
1.0000 | INTRAUTERINE_SYSTEM | Freq: Once | INTRAUTERINE | Status: AC
  Administered 2022-01-05: 1 via INTRAUTERINE

## 2022-01-05 NOTE — Progress Notes (Signed)
RGYN patient here for IUD Insertion.  Last Pap: 06/09/21 ASCUS Neg HPV   *Pt has been on [employer]  Pt trying to stop smoking has recently cut down 1 pack every 2 days.   FBP:[id number].

## 2022-01-05 NOTE — Procedures (Signed)
Intrauterine Device (IUD) Insertion Procedure Note  After written consent was obtained, her urine pregnancy test was done and was negative. The patient understands the risks of IUD placement, which include but are not limited to: bleeding, infection, uterine perforation, risk of expulsion, risk of failure < 1%, increased risk of ectopic pregnancy in the event of failure.   Prior to the procedure being performed, the patient (or guardian) was asked to state their full name, date of birth, and the type of procedure being performed. A bimanual exam showed the uterus to be midposition.  Next, the cervix and vagina were cleaned with an antiseptic solution, and the cervix was grasped with a tenaculum.  The uterus was sounded to 9 cm.  The Mirena was placed without difficulty in the usual fashion.  The strings were cut to 3-4 cm.  The tenaculum was removed and cervix was found to be hemostatic.    No complications, patient tolerated the procedure well.  Durene Romans MD Attending Center for [employer] [facility] farm manager)
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
## 2022-01-09 LAB — CERVICOVAGINAL ANCILLARY ONLY
Bacterial Vaginitis (gardnerella): NEGATIVE
Candida Glabrata: POSITIVE — AB
Candida Vaginitis: POSITIVE — AB
Chlamydia: NEGATIVE
Comment: NEGATIVE
Comment: NEGATIVE
Comment: NEGATIVE
Comment: NEGATIVE
Comment: NEGATIVE
Comment: NORMAL
Neisseria Gonorrhea: NEGATIVE
Trichomonas: NEGATIVE

## 2022-01-17 ENCOUNTER — Telehealth: Payer: Self-pay | Admitting: Nurse Practitioner

## 2022-01-17 DIAGNOSIS — F419 Anxiety disorder, unspecified: Secondary | ICD-10-CM

## 2022-01-17 DIAGNOSIS — I10 Essential (primary) hypertension: Secondary | ICD-10-CM

## 2022-01-18 ENCOUNTER — Encounter: Payer: Self-pay | Admitting: Nurse Practitioner

## 2022-01-18 DIAGNOSIS — I10 Essential (primary) hypertension: Secondary | ICD-10-CM

## 2022-01-18 DIAGNOSIS — F32A Depression, unspecified: Secondary | ICD-10-CM

## 2022-01-18 MED ORDER — OLMESARTAN MEDOXOMIL 20 MG PO TABS: 20.0000 mg | ORAL_TABLET | Freq: Every day | ORAL | 0 refills | Status: DC

## 2022-01-18 MED ORDER — SERTRALINE HCL 50 MG PO TABS: 50.0000 mg | ORAL_TABLET | Freq: Every day | ORAL | 0 refills | Status: DC

## 2022-01-18 MED ORDER — METOPROLOL SUCCINATE ER 25 MG PO TB24: 25.0000 mg | ORAL_TABLET | Freq: Every day | ORAL | 0 refills | Status: DC

## 2022-01-18 NOTE — Telephone Encounter (Signed)
LVM for patient to call and schedule appointment. Sent MyChart message.

## 2022-01-18 NOTE — Telephone Encounter (Signed)
Please schedule for CPE within 4-8 weeks

## 2022-01-19 NOTE — Telephone Encounter (Signed)
Patient has been scheduled

## 2022-01-19 NOTE — Telephone Encounter (Signed)
FYI, thanks.

## 2022-01-24 ENCOUNTER — Encounter: Payer: Self-pay | Admitting: Nurse Practitioner

## 2022-01-24 ENCOUNTER — Ambulatory Visit (INDEPENDENT_AMBULATORY_CARE_PROVIDER_SITE_OTHER): Payer: BC Managed Care – PPO | Admitting: Nurse Practitioner

## 2022-01-24 VITALS — BP 124/88 | HR 89 | Ht 62.0 in | Wt 147.0 lb

## 2022-01-24 DIAGNOSIS — F419 Anxiety disorder, unspecified: Secondary | ICD-10-CM | POA: Diagnosis not present

## 2022-01-24 DIAGNOSIS — Z720 Tobacco use: Secondary | ICD-10-CM

## 2022-01-24 DIAGNOSIS — E663 Overweight: Secondary | ICD-10-CM | POA: Diagnosis not present

## 2022-01-24 DIAGNOSIS — Z0000 Encounter for general adult medical examination without abnormal findings: Secondary | ICD-10-CM | POA: Diagnosis not present

## 2022-01-24 DIAGNOSIS — F909 Attention-deficit hyperactivity disorder, unspecified type: Secondary | ICD-10-CM

## 2022-01-24 DIAGNOSIS — I10 Essential (primary) hypertension: Secondary | ICD-10-CM

## 2022-01-24 DIAGNOSIS — E538 Deficiency of other specified B group vitamins: Secondary | ICD-10-CM

## 2022-01-24 DIAGNOSIS — F32A Depression, unspecified: Secondary | ICD-10-CM

## 2022-01-24 LAB — TSH: TSH: 1.76 u[IU]/mL (ref 0.35–5.50)

## 2022-01-24 LAB — CBC
HCT: 41.2 % (ref 36.0–46.0)
Hemoglobin: 14.1 g/dL (ref 12.0–15.0)
MCHC: 34.1 g/dL (ref 30.0–36.0)
MCV: 96.6 fl (ref 78.0–100.0)
Platelets: 380 10*3/uL (ref 150.0–400.0)
RBC: 4.26 Mil/uL (ref 3.87–5.11)
RDW: 14.5 % (ref 11.5–15.5)
WBC: 6.9 10*3/uL (ref 4.0–10.5)

## 2022-01-24 LAB — COMPREHENSIVE METABOLIC PANEL
ALT: 20 U/L (ref 0–35)
AST: 18 U/L (ref 0–37)
Albumin: 4.1 g/dL (ref 3.5–5.2)
Alkaline Phosphatase: 93 U/L (ref 39–117)
BUN: 10 mg/dL (ref 6–23)
CO2: 28 mEq/L (ref 19–32)
Calcium: 9.4 mg/dL (ref 8.4–10.5)
Chloride: 103 mEq/L (ref 96–112)
Creatinine, Ser: 0.56 mg/dL (ref 0.40–1.20)
GFR: 115.57 mL/min (ref >60.00)
Glucose, Bld: 78 mg/dL (ref 70–99)
Potassium: 3.8 mEq/L (ref 3.5–5.1)
Sodium: 139 mEq/L (ref 135–145)
Total Bilirubin: 0.3 mg/dL (ref 0.2–1.2)
Total Protein: 6.8 g/dL (ref 6.0–8.3)

## 2022-01-24 LAB — LIPID PANEL
Cholesterol: 158 mg/dL (ref 0–200)
HDL: 41.6 mg/dL (ref >39.00)
LDL Cholesterol: 101 mg/dL — ABNORMAL HIGH (ref 0–99)
NonHDL: 116.32
Total CHOL/HDL Ratio: 4
Triglycerides: 79 mg/dL (ref 0.0–149.0)
VLDL: 15.8 mg/dL (ref 0.0–40.0)

## 2022-01-24 LAB — HEMOGLOBIN A1C: Hgb A1c MFr Bld: 5.4 % (ref 4.6–6.5)

## 2022-01-24 LAB — FOLATE: Folate: 3.3 ng/mL — ABNORMAL LOW (ref >5.9)

## 2022-01-24 MED ORDER — BUSPIRONE HCL 5 MG PO TABS: 5.0000 mg | ORAL_TABLET | Freq: Two times a day (BID) | ORAL | 0 refills | Status: DC
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
## 2022-01-24 NOTE — Assessment & Plan Note (Signed)
History of the same.  Patient did folate replacement for 3 months but never rechecked.  Pending folate level today

## 2022-01-24 NOTE — Assessment & Plan Note (Signed)
Worse with increased stress.  She states her anxiety is making her ADHD worse and is affecting work.  Will treat anxiety to see if ADHD follow-up back to baseline

## 2022-01-24 NOTE — Patient Instructions (Signed)
Nice to see you today I will be in touch with the labs once I have them Follow up with me in 1 year for your next physical, sooner if you need me
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
## 2022-01-24 NOTE — Assessment & Plan Note (Signed)
Discussed age-appropriate immunizations and screening exams.  Patient is up-to-date on Pap smear and breast cancer screening.  Too young for CRC screening.  Patient was given information at discharge in regards to preventative healthcare maintenance with anticipatory guidance for age range

## 2022-01-24 NOTE — Assessment & Plan Note (Signed)
Patient still currently smoking.  She has cut back from a pack a day to about half a pack a day.  Continue working on weaning down and eventually stopping.  Does not qualify for LDCT

## 2022-01-24 NOTE — Assessment & Plan Note (Signed)
Patient currently maintained on metoprolol and olmesartan.  Patient's blood pressure within normal limits.  Patient tolerating medications well.  Continue medication as prescribed

## 2022-01-24 NOTE — Assessment & Plan Note (Signed)
Patient started going to the gym.  Continue work on healthy lifestyle modifications pending lab work inclusive of A1c

## 2022-01-25 ENCOUNTER — Other Ambulatory Visit: Payer: Self-pay | Admitting: Nurse Practitioner

## 2022-01-25 ENCOUNTER — Encounter: Payer: Self-pay | Admitting: Obstetrics and Gynecology

## 2022-01-25 DIAGNOSIS — E538 Deficiency of other specified B group vitamins: Secondary | ICD-10-CM

## 2022-01-25 MED ORDER — FOLIC ACID 1 MG PO TABS: 1.0000 mg | ORAL_TABLET | Freq: Every day | ORAL | 0 refills | Status: DC

## 2022-01-30 ENCOUNTER — Other Ambulatory Visit: Payer: Self-pay | Admitting: Obstetrics and Gynecology

## 2022-01-30 ENCOUNTER — Ambulatory Visit
Admission: RE | Admit: 2022-01-30 | Discharge: 2022-01-30 | Disposition: A | Discharge status: Home / Self Care | Payer: BC Managed Care – PPO | Source: Ambulatory Visit | Attending: Obstetrics and Gynecology | Admitting: Obstetrics and Gynecology

## 2022-01-30 ENCOUNTER — Encounter: Payer: Self-pay | Admitting: Obstetrics and Gynecology

## 2022-01-30 DIAGNOSIS — R921 Mammographic calcification found on diagnostic imaging of breast: Secondary | ICD-10-CM | POA: Diagnosis not present

## 2022-01-30 MED ORDER — BORIC ACID CRYS: 600.0000 mg | CRYSTALS | Freq: Every day | 1 refills | Status: AC

## 2022-01-31 ENCOUNTER — Other Ambulatory Visit: Payer: Self-pay | Admitting: Obstetrics and Gynecology

## 2022-01-31 DIAGNOSIS — R921 Mammographic calcification found on diagnostic imaging of breast: Secondary | ICD-10-CM

## 2022-02-08 NOTE — Telephone Encounter (Signed)
Can we get her scheduled to discuss going back on the add/adhd medication

## 2022-02-10 ENCOUNTER — Ambulatory Visit (INDEPENDENT_AMBULATORY_CARE_PROVIDER_SITE_OTHER): Payer: BC Managed Care – PPO | Admitting: Nurse Practitioner

## 2022-02-10 ENCOUNTER — Encounter: Payer: Self-pay | Admitting: Nurse Practitioner

## 2022-02-10 VITALS — BP 136/90 | HR 86 | Temp 98.0°F | Resp 16 | Ht 62.0 in | Wt 146.4 lb

## 2022-02-10 DIAGNOSIS — F419 Anxiety disorder, unspecified: Secondary | ICD-10-CM | POA: Diagnosis not present

## 2022-02-10 DIAGNOSIS — I10 Essential (primary) hypertension: Secondary | ICD-10-CM | POA: Diagnosis not present

## 2022-02-10 DIAGNOSIS — F902 Attention-deficit hyperactivity disorder, combined type: Secondary | ICD-10-CM | POA: Diagnosis not present

## 2022-02-10 DIAGNOSIS — F32A Depression, unspecified: Secondary | ICD-10-CM

## 2022-02-10 MED ORDER — AMPHETAMINE-DEXTROAMPHETAMINE 10 MG PO TABS: 10.0000 mg | ORAL_TABLET | Freq: Two times a day (BID) | ORAL | 0 refills | Status: DC

## 2022-02-10 NOTE — Assessment & Plan Note (Signed)
Historical diagnosis done from previous physician.  She had tried Adderall XR in the past but caused insomnia and went to Adderall IR looks like up to 20 mg twice daily.  Will start patient at 10 mg twice daily did discuss controlled substance agreement and urine drug screen wait until dose is stabilized and then checked with the patient.  Follow-up 1 month
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
## 2022-02-10 NOTE — Assessment & Plan Note (Signed)
Blood pressure slightly elevated on recheck today.  Patient currently on metoprolol and olmesartan.  Plan to monitor closely been placed back on Adderall.  Follow-up 1 month

## 2022-02-10 NOTE — Assessment & Plan Note (Signed)
Patient was put on buspirone last month without great relief.  Will discontinue continue sertraline 50 mg.  Did warn patient that sometimes stimulating medication can make anxiety worse.  If this happens we can titrate up on her sertraline.  Follow-up 1 month

## 2022-02-10 NOTE — Patient Instructions (Addendum)
Nice to see you today I have sent in the medication to the pharmacy. Take it twice a day If you have trouble sleeping or get palpitations (heart flutters) let me know I want to see you in 1 month, sooner if you need me

## 2022-02-16 ENCOUNTER — Ambulatory Visit (INDEPENDENT_AMBULATORY_CARE_PROVIDER_SITE_OTHER): Payer: BC Managed Care – PPO | Admitting: Obstetrics and Gynecology

## 2022-02-16 ENCOUNTER — Encounter: Payer: Self-pay | Admitting: Obstetrics and Gynecology

## 2022-02-16 VITALS — BP 124/90 | HR 102 | Wt 143.0 lb

## 2022-02-16 DIAGNOSIS — B3731 Acute candidiasis of vulva and vagina: Secondary | ICD-10-CM | POA: Diagnosis not present

## 2022-02-16 DIAGNOSIS — Z30431 Encounter for routine checking of intrauterine contraceptive device: Secondary | ICD-10-CM | POA: Diagnosis not present
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
## 2022-02-16 NOTE — Progress Notes (Signed)
RGYN patient here per notes to F/U IUD   Contraception: IUD Last pap: 06/09/21 ASCUS Neg HPV  Pt notes bleeding but has no concerns today. Declines exam.

## 2022-02-16 NOTE — Patient Instructions (Signed)
Kefir (coconut) Sauerkraut [place]  Quarter cup each night for six weeks and then 2-3 times per week thereaftetr

## 2022-03-06 ENCOUNTER — Other Ambulatory Visit: Payer: Self-pay | Admitting: Nurse Practitioner

## 2022-03-06 ENCOUNTER — Encounter: Payer: Self-pay | Admitting: Nurse Practitioner

## 2022-03-06 DIAGNOSIS — F902 Attention-deficit hyperactivity disorder, combined type: Secondary | ICD-10-CM

## 2022-03-06 MED ORDER — AMPHETAMINE-DEXTROAMPHETAMINE 10 MG PO TABS: 10.0000 mg | ORAL_TABLET | Freq: Two times a day (BID) | ORAL | 0 refills | Status: DC

## 2022-03-10 ENCOUNTER — Ambulatory Visit: Payer: BC Managed Care – PPO | Admitting: Nurse Practitioner

## 2022-03-15 ENCOUNTER — Encounter: Payer: Self-pay | Admitting: Nurse Practitioner

## 2022-03-15 ENCOUNTER — Ambulatory Visit (INDEPENDENT_AMBULATORY_CARE_PROVIDER_SITE_OTHER): Payer: BC Managed Care – PPO | Admitting: Nurse Practitioner

## 2022-03-15 VITALS — BP 124/82 | HR 62 | Temp 98.1°F | Resp 16 | Ht 62.0 in | Wt 145.4 lb

## 2022-03-15 DIAGNOSIS — F909 Attention-deficit hyperactivity disorder, unspecified type: Secondary | ICD-10-CM | POA: Diagnosis not present

## 2022-03-15 MED ORDER — AMPHETAMINE-DEXTROAMPHETAMINE 15 MG PO TABS: 15.0000 mg | ORAL_TABLET | Freq: Two times a day (BID) | ORAL | 0 refills | Status: DC

## 2022-03-15 NOTE — Assessment & Plan Note (Signed)
Patient had benefit with Adderall 10 mg twice daily given at last follow-up.  Titrated up to Adderall 15 mg twice daily last time she was on 20 mg twice daily she may have to go back to that dose.  Blood pressure actually within normal limits today.  Patient has been working on lifestyle modifications.  PDMP information reviewed.  Patient can get this new prescription filled 04/06/2022 recently sent in prescription was filled on 03/09/2022.  Follow-up 6 weeks

## 2022-03-15 NOTE — Patient Instructions (Signed)
Nice to see you today  I have sent in the new prescription. You can get it filled on 04/0/2024 Follow up with me in 6 weeks for a recheck, sooner if you need me

## 2022-03-15 NOTE — Progress Notes (Signed)
Established Patient Office Visit  Subjective   Patient ID: Catherine [name], female    DOB: Oct 02, 1985  Age: 37 y.o. MRN: [id number]  Chief Complaint  Patient presents with   ADD    HPI  ADD: Patient was seen by me on 03/11/2022 for ADHD medication.  She used to be on medication in the past with Dr. Marjory Lies and was evaluated by Dr. Donley Redder that she was on Adderall XR in the past but had difficulty with falling asleep and she switched back to Adderall IR 20 mg twice a day at last office visit we did place patient on Adderall 10 mg twice daily.  Patient is here for follow-up  States that she feels like it is working but not as well as it has in the past. States that at lunch time she feels like the medication is helpufol. She will start things but stop dong them.  States that she did sign up for a 6 month spanish course through work and did not compelte the process   States that she is eating better and has cut back to only one soda. States that she is doing exercise. States that she has put a puase on the gym but is doing workouts at home. Will be starting with co worker in a couple weeks.     Review of Systems  Constitutional:  Negative for chills and fever.  Respiratory:  Negative for shortness of breath.   Cardiovascular:  Negative for chest pain.  Gastrointestinal:  Negative for abdominal pain, constipation and diarrhea.  Genitourinary:  Negative for dysuria and hematuria.  Neurological:  Negative for headaches.  Psychiatric/Behavioral:  The patient has insomnia.       Objective:     BP 124/82   Pulse 62   Temp 98.1 F (36.7 C)   Resp 16   Ht '5\' 2"'$  (1.575 m)   Wt 145 lb 6 oz (65.9 kg)   SpO2 96%   BMI 26.59 kg/m  BP Readings from Last 3 Encounters:  03/15/22 124/82  02/16/22 (!) 124/90  02/10/22 (!) 136/90   Wt Readings from Last 3 Encounters:  03/15/22 145 lb 6 oz (65.9 kg)  02/16/22 143 lb (64.9 kg)  02/10/22 146 lb 6 oz (66.4 kg)      Physical  Exam Vitals and nursing note reviewed.  Constitutional:      Appearance: Normal appearance.  Cardiovascular:     Rate and Rhythm: Normal rate and regular rhythm.     Heart sounds: Normal heart sounds.  Pulmonary:     Effort: Pulmonary effort is normal.     Breath sounds: Normal breath sounds.  Neurological:     Mental Status: She is alert.      No results found for any visits on 03/15/22.    The ASCVD Risk score (Arnett DK, et al., 2019) failed to calculate for the following reasons:   The 2019 ASCVD risk score is only valid for ages 69 to 15    Assessment & Plan:   Problem List Items Addressed This Visit       Other   ADHD (attention deficit hyperactivity disorder) - Primary    Patient had benefit with Adderall 10 mg twice daily given at last follow-up.  Titrated up to Adderall 15 mg twice daily last time she was on 20 mg twice daily she may have to go back to that dose.  Blood pressure actually within normal limits today.  Patient has been working  on lifestyle modifications.  PDMP information reviewed.  Patient can get this new prescription filled 04/06/2022 recently sent in prescription was filled on 03/09/2022.  Follow-up 6 weeks      Relevant Medications   amphetamine-dextroamphetamine (ADDERALL) 15 MG tablet    Return in about 6 weeks (around 04/26/2022) for ADHD medicatoin recheck .    Romilda Garret, NP

## 2022-04-20 ENCOUNTER — Other Ambulatory Visit: Payer: Self-pay | Admitting: Nurse Practitioner

## 2022-04-20 DIAGNOSIS — I10 Essential (primary) hypertension: Secondary | ICD-10-CM

## 2022-04-20 DIAGNOSIS — F419 Anxiety disorder, unspecified: Secondary | ICD-10-CM

## 2022-04-20 MED ORDER — OLMESARTAN MEDOXOMIL 20 MG PO TABS: 20.0000 mg | ORAL_TABLET | Freq: Every day | ORAL | 1 refills | Status: DC

## 2022-04-20 MED ORDER — SERTRALINE HCL 50 MG PO TABS: 50.0000 mg | ORAL_TABLET | Freq: Every day | ORAL | 1 refills | Status: DC

## 2022-04-20 MED ORDER — METOPROLOL SUCCINATE ER 25 MG PO TB24: 25.0000 mg | ORAL_TABLET | Freq: Every day | ORAL | 1 refills | Status: DC

## 2022-04-26 ENCOUNTER — Encounter: Payer: Self-pay | Admitting: Nurse Practitioner

## 2022-04-26 ENCOUNTER — Ambulatory Visit (INDEPENDENT_AMBULATORY_CARE_PROVIDER_SITE_OTHER): Payer: BC Managed Care – PPO | Admitting: Nurse Practitioner

## 2022-04-26 VITALS — BP 138/90 | HR 97 | Temp 97.7°F | Resp 16 | Ht 62.0 in | Wt 151.0 lb

## 2022-04-26 DIAGNOSIS — Z860100 Personal history of colon polyps, unspecified: Secondary | ICD-10-CM

## 2022-04-26 DIAGNOSIS — K589 Irritable bowel syndrome without diarrhea: Secondary | ICD-10-CM | POA: Diagnosis not present

## 2022-04-26 DIAGNOSIS — F909 Attention-deficit hyperactivity disorder, unspecified type: Secondary | ICD-10-CM

## 2022-04-26 DIAGNOSIS — F902 Attention-deficit hyperactivity disorder, combined type: Secondary | ICD-10-CM

## 2022-04-26 DIAGNOSIS — I10 Essential (primary) hypertension: Secondary | ICD-10-CM | POA: Diagnosis not present

## 2022-04-26 DIAGNOSIS — R12 Heartburn: Secondary | ICD-10-CM

## 2022-04-26 MED ORDER — AMPHETAMINE-DEXTROAMPHETAMINE 15 MG PO TABS: 15.0000 mg | ORAL_TABLET | Freq: Two times a day (BID) | ORAL | 0 refills | Status: DC

## 2022-04-26 MED ORDER — OMEPRAZOLE 20 MG PO CPDR: 20.0000 mg | DELAYED_RELEASE_CAPSULE | Freq: Every day | ORAL | 3 refills | Status: DC

## 2022-04-26 NOTE — Assessment & Plan Note (Signed)
Longstanding history of bowel issues does sound like some IBS.  Patient's family does see Dr. Chales Abrahams in [place] and she would like a referral.  Ambulatory referral placed today

## 2022-04-26 NOTE — Assessment & Plan Note (Signed)
Patient currently maintained on Adderall 15 mg twice daily.  Patient is tolerating the medicine well per her report.  No red flags in office.  Did review PDMP continue Adderall 15 mg twice daily

## 2022-04-26 NOTE — Assessment & Plan Note (Signed)
Patient currently maintained on metoprolol and olmesartan.  Patient has not taken blood pressure medication today.  Instructed to take when she gets home.  Check blood pressure intermittently at home parameters given to patient will reach out.  Follow-up 3 months for recheck on blood pressure

## 2022-04-26 NOTE — Progress Notes (Signed)
Established Patient Office Visit  Subjective   Patient ID: Catherine [name], female    DOB: 1985/11/04  Age: 37 y.o. MRN: [id number]  Chief Complaint  Patient presents with   ADHD    HPI  ADHD: Patient was seen by me on 02/10/2022.  Patient was placed back on Adderall 10 mg IR twice daily.  Of note patient's blood pressure was slightly elevated at last office it.  Patient also does have a coexisting condition of anxiety and depression we did discuss about stimulants sometimes making those worse.  States she did well in the past being on Adderall as it did calm her worry of going from task to task or subject to subject.  Patient is here today for a follow-up  States that she feels like the medication is effective. And she is not having any adverse drug effects  HTN: states that she has not taken her Bp meds today and has a wrist cuff at home.   State that she has stopped smoking states that she has been smoke free for almost a month. States that she is doing nicotene pouches that are    Stomach issues: states that she has been having it for a really long time. States that she has a hx of gerd. She does have some tummy pain. States that she will have diarrhea and then constipation. States that she has a family history stomach issues. Both of her siblings go to [place]. States she has had a colonoscopy with polp in her 17s. States that she has taken omeprazole in the past.    Review of Systems  Constitutional:  Negative for chills and fever.  Respiratory:  Negative for shortness of breath.   Cardiovascular:  Negative for chest pain and palpitations.  Neurological:  Negative for headaches.  Psychiatric/Behavioral:  Negative for hallucinations and suicidal ideas.       Objective:     BP (!) 138/90   Pulse 97   Temp 97.7 F (36.5 C)   Resp 16   Ht  (1.575 m)   Wt 151 lb (68.5 kg)   SpO2 99%   BMI 27.62 kg/m  BP Readings from Last 3 Encounters:  04/26/22 (!) 138/90   03/15/22 124/82  02/16/22 (!) 124/90   Wt Readings from Last 3 Encounters:  04/26/22 151 lb (68.5 kg)  03/15/22 145 lb 6 oz (65.9 kg)  02/16/22 143 lb (64.9 kg)      Physical Exam Vitals and nursing note reviewed.  Constitutional:      Appearance: Normal appearance.  Cardiovascular:     Rate and Rhythm: Normal rate and regular rhythm.     Heart sounds: Normal heart sounds.  Pulmonary:     Effort: Pulmonary effort is normal.     Breath sounds: Normal breath sounds.  Neurological:     Mental Status: She is alert.      No results found for any visits on 04/26/22.    The ASCVD Risk score (Arnett DK, et al., 2019) failed to calculate for the following reasons:   The 2019 ASCVD risk score is only valid for ages 84 to 36    Assessment & Plan:   Problem List Items Addressed This Visit       Cardiovascular and Mediastinum   Primary hypertension    Patient currently maintained on metoprolol and olmesartan.  Patient has not taken blood pressure medication today.  Instructed to take when she gets home.  Check blood pressure intermittently  at home parameters given to patient will reach out.  Follow-up 3 months for recheck on blood pressure        Digestive   Irritable bowel syndrome    Longstanding history of bowel issues does sound like some IBS.  Patient's family does see Dr. Chales Abrahams in [place] and she would like a referral.  Ambulatory referral placed today      Relevant Medications   omeprazole (PRILOSEC) 20 MG capsule   Other Relevant Orders   Ambulatory referral to Gastroenterology     Other   ADHD (attention deficit hyperactivity disorder) - Primary    Patient currently maintained on Adderall 15 mg twice daily.  Patient is tolerating the medicine well per her report.  No red flags in office.  Did review PDMP continue Adderall 15 mg twice daily      Relevant Medications   amphetamine-dextroamphetamine (ADDERALL) 15 MG tablet   Other Visit Diagnoses      History of colon polyps       Relevant Orders   Ambulatory referral to Gastroenterology   Heartburn       Relevant Medications   omeprazole (PRILOSEC) 20 MG capsule       Return in about 3 months (around 07/26/2022) for BP recheck/ADHD medication recheck .    Audria Nine, NP

## 2022-04-26 NOTE — Patient Instructions (Addendum)
Nice to see you today I need you to check your blood pressure a  couple times over the next 2 weeks. Send the readings to me via mychart please  Follow up with me in 3 months for a recheck   I want you blood pressure to be under 140 on the top and under 90 on the bottom consistently   [place] job on stopping smoking. Keep it up

## 2022-05-12 ENCOUNTER — Encounter: Payer: Self-pay | Admitting: Nurse Practitioner

## 2022-05-22 ENCOUNTER — Other Ambulatory Visit: Payer: Self-pay | Admitting: Nurse Practitioner

## 2022-05-22 DIAGNOSIS — F909 Attention-deficit hyperactivity disorder, unspecified type: Secondary | ICD-10-CM

## 2022-05-22 MED ORDER — AMPHETAMINE-DEXTROAMPHETAMINE 15 MG PO TABS: 15.0000 mg | ORAL_TABLET | Freq: Two times a day (BID) | ORAL | 0 refills | Status: DC

## 2022-06-19 ENCOUNTER — Other Ambulatory Visit: Payer: Self-pay | Admitting: Nurse Practitioner

## 2022-06-19 DIAGNOSIS — F909 Attention-deficit hyperactivity disorder, unspecified type: Secondary | ICD-10-CM

## 2022-06-20 MED ORDER — AMPHETAMINE-DEXTROAMPHETAMINE 15 MG PO TABS: 15.0000 mg | ORAL_TABLET | Freq: Two times a day (BID) | ORAL | 0 refills | Status: DC

## 2022-07-12 ENCOUNTER — Other Ambulatory Visit: Payer: Self-pay | Admitting: Nurse Practitioner

## 2022-07-12 DIAGNOSIS — F909 Attention-deficit hyperactivity disorder, unspecified type: Secondary | ICD-10-CM

## 2022-07-13 ENCOUNTER — Other Ambulatory Visit: Payer: Self-pay | Admitting: Nurse Practitioner

## 2022-07-13 DIAGNOSIS — F909 Attention-deficit hyperactivity disorder, unspecified type: Secondary | ICD-10-CM

## 2022-07-14 MED ORDER — AMPHETAMINE-DEXTROAMPHETAMINE 15 MG PO TABS: 15.0000 mg | ORAL_TABLET | Freq: Two times a day (BID) | ORAL | 0 refills | Status: DC

## 2022-07-14 NOTE — Telephone Encounter (Signed)
Duplicate request please deny

## 2022-07-26 ENCOUNTER — Ambulatory Visit: Payer: Medicaid Other | Admitting: Nurse Practitioner

## 2022-07-26 ENCOUNTER — Encounter: Payer: Self-pay | Admitting: Nurse Practitioner

## 2022-07-26 VITALS — BP 138/100 | HR 79 | Temp 98.1°F | Ht 62.0 in | Wt 150.2 lb

## 2022-07-26 DIAGNOSIS — F909 Attention-deficit hyperactivity disorder, unspecified type: Secondary | ICD-10-CM | POA: Diagnosis not present

## 2022-07-26 DIAGNOSIS — I10 Essential (primary) hypertension: Secondary | ICD-10-CM | POA: Diagnosis not present

## 2022-07-26 MED ORDER — HYDROCHLOROTHIAZIDE 12.5 MG PO TABS: 12.5000 mg | ORAL_TABLET | Freq: Every day | ORAL | 0 refills | Status: DC

## 2022-07-26 MED ORDER — AMPHETAMINE-DEXTROAMPHETAMINE 15 MG PO TABS: 15.0000 mg | ORAL_TABLET | Freq: Two times a day (BID) | ORAL | 0 refills | Status: DC

## 2022-07-26 NOTE — Assessment & Plan Note (Signed)
Patient currently maintained on metoprolol 25 mg, olmesartan 20 mg.  Patient is adherent to blood pressure regimen blood pressure still above goal we will add on hydrochlorothiazide 12.5 mg daily.  Follow-up 1 month for BMP recheck and blood pressure recheck.

## 2022-07-26 NOTE — Assessment & Plan Note (Addendum)
Patient currently maintained on Adderall 15 mg twice daily.  Tolerates medication well.  Likely could benefit from a dose increase but given uncontrolled hypertension defer dose increase

## 2022-07-26 NOTE — Progress Notes (Addendum)
   Established Patient Office Visit  Subjective   Patient ID: Catherine [name], female    DOB: 06/08/1985  Age: 37 y.o. MRN: [id number]  Chief Complaint  Patient presents with   Hypertension   ADHD    HTN: patient does hav e a wrist cuff at home. She is currenlty managed on metoprolol 25 and olmesartan 20. States that she has not been checking her bp at home but is taking her meds without ade   ADHD: patient is currently maintained on adderall 15mg  BID. States that she is tolerating the medication well. State that she is not having and adverse drug events. States that she is still struggling with starting things and not finishing them or staying on task.    Review of Systems  Constitutional:  Negative for chills and fever.  Respiratory:  Negative for shortness of breath.   Cardiovascular:  Negative for chest pain.  Neurological:  Negative for headaches.  Psychiatric/Behavioral:  Negative for hallucinations and suicidal ideas. The patient does not have insomnia.       Objective:     BP (!) 138/100   Pulse 79   Temp 98.1 F (36.7 C) (Temporal)   Ht 5\' 2"  (1.575 m)   Wt 150 lb 3.2 oz (68.1 kg)   LMP 07/05/2022 (Approximate)   SpO2 99%   BMI 27.47 kg/m  BP Readings from Last 3 Encounters:  07/26/22 (!) 138/100  04/26/22 (!) 138/90  03/15/22 124/82   Wt Readings from Last 3 Encounters:  07/26/22 150 lb 3.2 oz (68.1 kg)  04/26/22 151 lb (68.5 kg)  03/15/22 145 lb 6 oz (65.9 kg)      Physical Exam Vitals and nursing note reviewed.  Constitutional:      Appearance: Normal appearance.  Cardiovascular:     Rate and Rhythm: Normal rate and regular rhythm.     Heart sounds: Normal heart sounds.  Pulmonary:     Effort: Pulmonary effort is normal.     Breath sounds: Normal breath sounds.  Neurological:     Mental Status: She is alert.      No results found for any visits on 07/26/22.    The ASCVD Risk score (Arnett DK, et al., 2019) failed to calculate for the  following reasons:   The 2019 ASCVD risk score is only valid for ages 61 to 28    Assessment & Plan:   Problem List Items Addressed This Visit       Cardiovascular and Mediastinum   Primary hypertension - Primary    Patient currently maintained on metoprolol 25 mg, olmesartan 20 mg.  Patient is adherent to blood pressure regimen blood pressure still above goal we will add on hydrochlorothiazide 12.5 mg daily.  Follow-up 1 month for BMP recheck and blood pressure recheck.      Relevant Medications   hydrochlorothiazide (HYDRODIURIL) 12.5 MG tablet     Other   ADHD (attention deficit hyperactivity disorder)    Patient currently maintained on Adderall 15 mg twice daily.  Tolerates medication well.  Likely could benefit from a dose increase but given uncontrolled hypertension defer dose increase      Relevant Medications   amphetamine-dextroamphetamine (ADDERALL) 15 MG tablet    Return in about 4 weeks (around 08/23/2022) for BP recheck/BMP.    Audria Nine, NP

## 2022-07-26 NOTE — Patient Instructions (Addendum)
Nice to see you today I want to see you in a month for blood pressure recheck and labs Follow up sooner if you need me

## 2022-08-02 ENCOUNTER — Encounter (INDEPENDENT_AMBULATORY_CARE_PROVIDER_SITE_OTHER): Payer: Self-pay

## 2022-08-09 ENCOUNTER — Other Ambulatory Visit: Payer: Self-pay | Admitting: Nurse Practitioner

## 2022-08-09 DIAGNOSIS — F909 Attention-deficit hyperactivity disorder, unspecified type: Secondary | ICD-10-CM

## 2022-08-10 NOTE — Telephone Encounter (Signed)
Can we see if the pharmacy has an adderall script on file for this patient please

## 2022-08-15 NOTE — Telephone Encounter (Signed)
Called pharmacy. Pt has picked up script on 8/11. Pharmacists states there are no more refills on file for this patient.

## 2022-08-23 ENCOUNTER — Ambulatory Visit: Payer: Medicaid Other | Admitting: Nurse Practitioner

## 2022-08-23 ENCOUNTER — Encounter: Payer: Self-pay | Admitting: Nurse Practitioner

## 2022-08-23 VITALS — BP 100/80 | HR 88 | Temp 97.8°F | Ht 62.0 in | Wt 145.2 lb

## 2022-08-23 DIAGNOSIS — I10 Essential (primary) hypertension: Secondary | ICD-10-CM

## 2022-08-23 DIAGNOSIS — R12 Heartburn: Secondary | ICD-10-CM | POA: Insufficient documentation

## 2022-08-23 DIAGNOSIS — F909 Attention-deficit hyperactivity disorder, unspecified type: Secondary | ICD-10-CM

## 2022-08-23 LAB — BASIC METABOLIC PANEL
BUN: 7 mg/dL (ref 6–23)
CO2: 28 mEq/L (ref 19–32)
Calcium: 9.5 mg/dL (ref 8.4–10.5)
Chloride: 97 mEq/L (ref 96–112)
Creatinine, Ser: 0.76 mg/dL (ref 0.40–1.20)
GFR: 98.82 mL/min (ref >60.00)
Glucose, Bld: 99 mg/dL (ref 70–99)
Potassium: 3.1 mEq/L — ABNORMAL LOW (ref 3.5–5.1)
Sodium: 135 mEq/L (ref 135–145)

## 2022-08-23 MED ORDER — OMEPRAZOLE 20 MG PO CPDR: 20.0000 mg | DELAYED_RELEASE_CAPSULE | Freq: Every day | ORAL | 1 refills | Status: DC

## 2022-08-23 NOTE — Progress Notes (Signed)
Established Patient Office Visit  Subjective   Patient ID: Catherine [name], female    DOB: May 20, 1985  Age: 37 y.o. MRN: [id number]  Chief Complaint  Patient presents with   Hypertension    Pt states she has been feeling fine. Does not check at home    Hypertension Pertinent negatives include no chest pain, headaches or shortness of breath.    HTN: patient is currently maintained on olmesartan and metoprolol. We did add on hydrochlorothiazide 12.5 mg last office visit and she is here for a recheck. States that she does not have a blood pressure cuff at home.      Review of Systems  Constitutional:  Negative for chills and fever.  Respiratory:  Negative for shortness of breath.   Cardiovascular:  Negative for chest pain.  Gastrointestinal:  Positive for nausea. Negative for abdominal pain, constipation, diarrhea and vomiting.  Neurological:  Negative for dizziness and headaches.      Objective:     BP 100/80   Pulse 88   Temp 97.8 F (36.6 C) (Temporal)   Ht 5\' 2"  (1.575 m)   Wt 145 lb 3.2 oz (65.9 kg)   LMP 08/09/2022 (Approximate)   SpO2 98%   BMI 26.56 kg/m  BP Readings from Last 3 Encounters:  08/23/22 100/80  07/26/22 (!) 138/100  04/26/22 (!) 138/90   Wt Readings from Last 3 Encounters:  08/23/22 145 lb 3.2 oz (65.9 kg)  07/26/22 150 lb 3.2 oz (68.1 kg)  04/26/22 151 lb (68.5 kg)      Physical Exam Vitals and nursing note reviewed.  Constitutional:      Appearance: Normal appearance.  Cardiovascular:     Rate and Rhythm: Normal rate and regular rhythm.     Heart sounds: Normal heart sounds.  Pulmonary:     Effort: Pulmonary effort is normal.     Breath sounds: Normal breath sounds.  Abdominal:     General: Bowel sounds are normal. There is no distension.     Palpations: There is no mass.     Tenderness: There is no abdominal tenderness.     Hernia: No hernia is present.  Neurological:     Mental Status: She is alert.      No results  found for any visits on 08/23/22.     The ASCVD Risk score (Arnett DK, et al., 2019) failed to calculate for the following reasons:   The 2019 ASCVD risk score is only valid for ages 68 to 59    Assessment & Plan:   Problem List Items Addressed This Visit       Cardiovascular and Mediastinum   Primary hypertension - Primary    Patient currently maintained on hydrochlorothiazide 12.5 mg, olmesartan 20 mg, metoprolol 25 mg.  Patient blood pressure under great control with the regimen today.  Will recheck BMP pending lab results follow-up 3 months for medication recheck and spot check blood pressure.  Patient is tolerating medications without out adverse drug event per her report      Relevant Orders   Basic metabolic panel     Other   ADHD (attention deficit hyperactivity disorder)   Heartburn    Patient has lost some weight since last office visit.  States that she feels queasy and sometimes does not feel like eating.  States that she came off the heartburn medicine she noticed a difference we will reobtain continue omeprazole 20 mg for 90 days.  Patient will reach out via MyChart  if she does not start improving within the next 2 to 3 weeks      Relevant Medications   omeprazole (PRILOSEC) 20 MG capsule    Return in about 3 months (around 11/23/2022).    Audria Nine, NP

## 2022-08-23 NOTE — Patient Instructions (Addendum)
Nice to see you today I will be in touch with the labs once I have reviewed them Follow up with me in 3 months, sooner if you need me   Keep me updated via mychart if the stomach medication does not help

## 2022-08-23 NOTE — Assessment & Plan Note (Signed)
Patient currently maintained on hydrochlorothiazide 12.5 mg, olmesartan 20 mg, metoprolol 25 mg.  Patient blood pressure under great control with the regimen today.  Will recheck BMP pending lab results follow-up 3 months for medication recheck and spot check blood pressure.  Patient is tolerating medications without out adverse drug event per her report

## 2022-08-23 NOTE — Assessment & Plan Note (Signed)
Patient has lost some weight since last office visit.  States that she feels queasy and sometimes does not feel like eating.  States that she came off the heartburn medicine she noticed a difference we will reobtain continue omeprazole 20 mg for 90 days.  Patient will reach out via MyChart if she does not start improving within the next 2 to 3 weeks

## 2022-08-25 ENCOUNTER — Other Ambulatory Visit: Payer: Self-pay | Admitting: Nurse Practitioner

## 2022-08-25 DIAGNOSIS — E876 Hypokalemia: Secondary | ICD-10-CM

## 2022-08-25 MED ORDER — POTASSIUM CHLORIDE CRYS ER 20 MEQ PO TBCR: 40.0000 meq | EXTENDED_RELEASE_TABLET | Freq: Every day | ORAL | 0 refills | Status: DC

## 2022-09-05 ENCOUNTER — Other Ambulatory Visit (INDEPENDENT_AMBULATORY_CARE_PROVIDER_SITE_OTHER): Payer: Medicaid Other

## 2022-09-05 DIAGNOSIS — E876 Hypokalemia: Secondary | ICD-10-CM | POA: Diagnosis not present

## 2022-09-06 LAB — BASIC METABOLIC PANEL
BUN: 15 mg/dL (ref 6–23)
CO2: 26 meq/L (ref 19–32)
Calcium: 9.3 mg/dL (ref 8.4–10.5)
Chloride: 101 meq/L (ref 96–112)
Creatinine, Ser: 0.69 mg/dL (ref 0.40–1.20)
GFR: 109.42 mL/min (ref >60.00)
Glucose, Bld: 94 mg/dL (ref 70–99)
Potassium: 3.7 meq/L (ref 3.5–5.1)
Sodium: 135 meq/L (ref 135–145)

## 2022-09-07 ENCOUNTER — Other Ambulatory Visit: Payer: Self-pay | Admitting: Nurse Practitioner

## 2022-09-07 DIAGNOSIS — F909 Attention-deficit hyperactivity disorder, unspecified type: Secondary | ICD-10-CM

## 2022-09-07 MED ORDER — AMPHETAMINE-DEXTROAMPHETAMINE 15 MG PO TABS: 15.0000 mg | ORAL_TABLET | Freq: Two times a day (BID) | ORAL | 0 refills | Status: DC

## 2022-09-09 ENCOUNTER — Other Ambulatory Visit: Payer: Self-pay | Admitting: Nurse Practitioner

## 2022-09-09 DIAGNOSIS — F909 Attention-deficit hyperactivity disorder, unspecified type: Secondary | ICD-10-CM

## 2022-09-11 ENCOUNTER — Other Ambulatory Visit: Payer: Self-pay | Admitting: Nurse Practitioner

## 2022-09-11 ENCOUNTER — Encounter: Payer: Self-pay | Admitting: Nurse Practitioner

## 2022-09-11 DIAGNOSIS — F909 Attention-deficit hyperactivity disorder, unspecified type: Secondary | ICD-10-CM

## 2022-09-15 ENCOUNTER — Telehealth: Payer: Self-pay | Admitting: Nurse Practitioner

## 2022-09-15 NOTE — Telephone Encounter (Signed)
-----   Message from [facility] Joellen T sent at 09/15/2022  2:55 PM EDT ----- No call back received ok to mail letter.

## 2022-09-15 NOTE — Telephone Encounter (Signed)
Letter is fine.

## 2022-09-18 NOTE — Telephone Encounter (Signed)
Letter mailed

## 2022-10-05 ENCOUNTER — Other Ambulatory Visit: Payer: Self-pay | Admitting: Nurse Practitioner

## 2022-10-05 DIAGNOSIS — F909 Attention-deficit hyperactivity disorder, unspecified type: Secondary | ICD-10-CM

## 2022-10-05 MED ORDER — AMPHETAMINE-DEXTROAMPHETAMINE 15 MG PO TABS: 15.0000 mg | ORAL_TABLET | Freq: Two times a day (BID) | ORAL | 0 refills | Status: DC

## 2022-10-05 NOTE — Telephone Encounter (Signed)
Last OV: 08/23/2022 Pending OV: Nothing scheduled at this time Medication Adderall 15mg  Directions: Take one tablet daily Last Refill: 09/07/2022 Qty: #60 with 0 refills

## 2022-10-18 ENCOUNTER — Encounter: Payer: Self-pay | Admitting: Nurse Practitioner

## 2022-10-18 ENCOUNTER — Ambulatory Visit: Payer: Medicaid Other | Admitting: Nurse Practitioner

## 2022-10-18 VITALS — BP 112/72 | HR 84 | Temp 98.1°F | Ht 62.0 in | Wt 151.8 lb

## 2022-10-18 DIAGNOSIS — H9202 Otalgia, left ear: Secondary | ICD-10-CM | POA: Diagnosis not present

## 2022-10-18 DIAGNOSIS — I10 Essential (primary) hypertension: Secondary | ICD-10-CM | POA: Diagnosis not present

## 2022-10-18 DIAGNOSIS — Z23 Encounter for immunization: Secondary | ICD-10-CM | POA: Diagnosis not present

## 2022-10-18 DIAGNOSIS — F419 Anxiety disorder, unspecified: Secondary | ICD-10-CM | POA: Diagnosis not present

## 2022-10-18 DIAGNOSIS — F32A Depression, unspecified: Secondary | ICD-10-CM | POA: Diagnosis not present

## 2022-10-18 MED ORDER — SERTRALINE HCL 50 MG PO TABS: 50.0000 mg | ORAL_TABLET | Freq: Every day | ORAL | 1 refills | Status: DC

## 2022-10-18 MED ORDER — OLMESARTAN MEDOXOMIL-HCTZ 20-12.5 MG PO TABS: 1.0000 | ORAL_TABLET | Freq: Every day | ORAL | 1 refills | Status: DC

## 2022-10-18 MED ORDER — FLUTICASONE PROPIONATE 50 MCG/ACT NA SUSP: 2.0000 | Freq: Every day | NASAL | 0 refills | Status: DC

## 2022-10-18 MED ORDER — METOPROLOL SUCCINATE ER 25 MG PO TB24: 25.0000 mg | ORAL_TABLET | Freq: Every day | ORAL | 1 refills | Status: DC

## 2022-10-18 NOTE — Patient Instructions (Signed)
Nice to see you today I have sent in the medications Follow up with me in 3 months,sooner if you need me

## 2022-10-18 NOTE — Progress Notes (Signed)
Established Patient Office Visit  Subjective   Patient ID: Catherine [name], female    DOB: 11/10/1985  Age: 37 y.o. MRN: [id number]  Chief Complaint  Patient presents with   Medication Management   Ear Pain    Left ear pain for about 2 weeks    HPI  Ear pain:u states that it istarted approx 2 weeks ago. States that it is intermittent. States that she has used hydrogen peroxide with out relief. States that she feels like there is a hair in there.  States that she had a could approx 2 weeks ago. States that she got over the cold      Review of Systems  Constitutional:  Negative for chills and fever.  Respiratory:  Negative for shortness of breath.   Cardiovascular:  Negative for chest pain.  Neurological:  Negative for dizziness and headaches.  Psychiatric/Behavioral:  Negative for hallucinations and suicidal ideas.       Objective:     BP 112/72   Pulse 84   Temp 98.1 F (36.7 C) (Oral)   Ht 5\' 2"  (1.575 m)   Wt 151 lb 12.8 oz (68.9 kg)   SpO2 98%   BMI 27.76 kg/m    Physical Exam Vitals and nursing note reviewed.  Constitutional:      Appearance: Normal appearance.  HENT:     Right Ear: Tympanic membrane, ear canal and external ear normal.     Left Ear: Tympanic membrane, ear canal and external ear normal.     Mouth/Throat:     Mouth: Mucous membranes are moist.     Pharynx: Oropharynx is clear.  Cardiovascular:     Rate and Rhythm: Normal rate and regular rhythm.     Heart sounds: Normal heart sounds.  Pulmonary:     Effort: Pulmonary effort is normal.     Breath sounds: Normal breath sounds.  Lymphadenopathy:     Cervical: No cervical adenopathy.  Neurological:     Mental Status: She is alert.      No results found for any visits on 10/18/22.    The ASCVD Risk score (Arnett DK, et al., 2019) failed to calculate for the following reasons:   The 2019 ASCVD risk score is only valid for ages 16 to 38    Assessment & Plan:   Problem List  Items Addressed This Visit       Cardiovascular and Mediastinum   Primary hypertension    Patient needed refill on her medications we will come on olmesartan and HCTZ into 1 pill.  New prescription sent in today blood pressure well-controlled      Relevant Medications   olmesartan-hydrochlorothiazide (BENICAR HCT) 20-12.5 MG tablet   metoprolol succinate (TOPROL-XL) 25 MG 24 hr tablet     Other   Anxiety and depression    Patient is been having a difficult month PHQ-9 score is elevated.  Did discuss about increasing sertraline patient would like to defer for right now.  Did offer patient therapy she would also like to defer right now she can message me via MyChart if she wants to go up to sertraline 100 mg or start therapy.      Relevant Medications   sertraline (ZOLOFT) 50 MG tablet   Left ear pain - Primary    Most likely EDT.  Start Flonase nasal spray 2 sprays each nostril daily.  Follow-up if no improvement.  If no improvement consider adding on antihistamine and possible prednisone taper.  Relevant Medications   fluticasone (FLONASE) 50 MCG/ACT nasal spray   Other Visit Diagnoses     Encounter for immunization       Relevant Orders   Flu vaccine trivalent PF, 6mos and older(Flulaval,Afluria,Fluarix,Fluzone) (Completed)       Return in about 3 months (around 01/18/2023) for ADD/ADHD med recheck .    Audria Nine, NP

## 2022-10-18 NOTE — Assessment & Plan Note (Signed)
Patient needed refill on her medications we will come on olmesartan and HCTZ into 1 pill.  New prescription sent in today blood pressure well-controlled

## 2022-10-18 NOTE — Assessment & Plan Note (Signed)
Patient is been having a difficult month PHQ-9 score is elevated.  Did discuss about increasing sertraline patient would like to defer for right now.  Did offer patient therapy she would also like to defer right now she can message me via MyChart if she wants to go up to sertraline 100 mg or start therapy.

## 2022-10-18 NOTE — Assessment & Plan Note (Signed)
Most likely EDT.  Start Flonase nasal spray 2 sprays each nostril daily.  Follow-up if no improvement.  If no improvement consider adding on antihistamine and possible prednisone taper.

## 2022-10-24 ENCOUNTER — Encounter: Payer: Self-pay | Admitting: Nurse Practitioner

## 2022-10-24 DIAGNOSIS — F902 Attention-deficit hyperactivity disorder, combined type: Secondary | ICD-10-CM

## 2022-10-30 MED ORDER — AMPHETAMINE-DEXTROAMPHETAMINE 20 MG PO TABS: 20.0000 mg | ORAL_TABLET | Freq: Two times a day (BID) | ORAL | 0 refills | Status: DC

## 2022-12-01 ENCOUNTER — Other Ambulatory Visit: Payer: Self-pay | Admitting: Nurse Practitioner

## 2022-12-01 DIAGNOSIS — F902 Attention-deficit hyperactivity disorder, combined type: Secondary | ICD-10-CM

## 2022-12-04 MED ORDER — AMPHETAMINE-DEXTROAMPHETAMINE 20 MG PO TABS: 20.0000 mg | ORAL_TABLET | Freq: Two times a day (BID) | ORAL | 0 refills | Status: DC

## 2022-12-05 ENCOUNTER — Encounter: Payer: Self-pay | Admitting: Nurse Practitioner

## 2022-12-05 ENCOUNTER — Ambulatory Visit: Payer: Medicaid Other | Admitting: Nurse Practitioner

## 2022-12-05 VITALS — BP 120/86 | HR 85 | Temp 98.3°F | Ht 62.0 in | Wt 158.4 lb

## 2022-12-05 DIAGNOSIS — I10 Essential (primary) hypertension: Secondary | ICD-10-CM

## 2022-12-05 DIAGNOSIS — J40 Bronchitis, not specified as acute or chronic: Secondary | ICD-10-CM

## 2022-12-05 DIAGNOSIS — G4489 Other headache syndrome: Secondary | ICD-10-CM | POA: Insufficient documentation

## 2022-12-05 DIAGNOSIS — R52 Pain, unspecified: Secondary | ICD-10-CM | POA: Diagnosis not present

## 2022-12-05 DIAGNOSIS — J029 Acute pharyngitis, unspecified: Secondary | ICD-10-CM

## 2022-12-05 DIAGNOSIS — R051 Acute cough: Secondary | ICD-10-CM

## 2022-12-05 DIAGNOSIS — F909 Attention-deficit hyperactivity disorder, unspecified type: Secondary | ICD-10-CM

## 2022-12-05 LAB — POCT FLU A/B STATUS
Influenza A, POC: NEGATIVE
Influenza B, POC: NEGATIVE

## 2022-12-05 LAB — POC COVID19 BINAXNOW: SARS Coronavirus 2 Ag: NEGATIVE

## 2022-12-05 LAB — POCT RAPID STREP A (OFFICE): Rapid Strep A Screen: NEGATIVE

## 2022-12-05 MED ORDER — BENZONATATE 200 MG PO CAPS: 200.0000 mg | ORAL_CAPSULE | Freq: Three times a day (TID) | ORAL | 0 refills | Status: DC | PRN

## 2022-12-05 MED ORDER — AZITHROMYCIN 250 MG PO TABS: ORAL_TABLET | ORAL | 0 refills | Status: AC

## 2022-12-05 NOTE — Assessment & Plan Note (Signed)
Strep, flu, COVID test negative in office.  Given length of symptoms and history of tobacco abuse we will treat with a dose of Avastin 250 mg pack as directed.  Patient given work excuse to be off for 2 days return subsequently referred for 24 hours without fever reducing medications.

## 2022-12-05 NOTE — Assessment & Plan Note (Signed)
Flu and COVID test in office.  Patient is over-the-counter analgesics as needed

## 2022-12-05 NOTE — Assessment & Plan Note (Signed)
Flu and COVID test in office.  Over-the-counter analgesics as needed.  Push fluids

## 2022-12-05 NOTE — Assessment & Plan Note (Signed)
Patient, maintained on Adderall 10 mg twice daily.  Blood pressure and pulse within normal limits patient is not having any ADE.  Continue medication as prescribed

## 2022-12-05 NOTE — Progress Notes (Signed)
Established Patient Office Visit  Subjective   Patient ID: Catherine [name], female    DOB: 23-Jan-1985  Age: 37 y.o. MRN: [id number]  Chief Complaint  Patient presents with   Medication Management    Adderall.    Sore Throat    Pt complain of losing voice and sore throat that started last Saturday. Pt states of body chills, no fever, and headache and cough.       ADHD: patient was on adderall 15mg  and did not feel like it was effective so we increased her dose to 20mg  a day and she is here for a recheck. States that she has noticed a difference with the dose increase. States that she feels more productive. States that she got a promotion at work and has deadlines and this has helped   Sore throat: symptoms started on Saturday 11/25/2022. Covid vaccine: original series Flu vaccine: 10/18/2022 States that it has gotten worse since it started. States that she did lose her voice this past Saturday States that her niece is sick and they had dinner that previous Thursday States that she has been taking tylenol, dayquill and nyquill. Not really helped much.     Review of Systems  Constitutional:  Positive for chills and malaise/fatigue. Negative for fever.  HENT:  Positive for sore throat. Negative for ear discharge, ear pain and sinus pain.   Respiratory:  Positive for cough, sputum production (thick and brown color) and shortness of breath.   Cardiovascular:  Negative for chest pain (chest tightness).  Gastrointestinal:  Positive for nausea and vomiting. Negative for abdominal pain, constipation and diarrhea.  Musculoskeletal:  Positive for myalgias.  Neurological:  Positive for headaches.      Objective:     BP 120/86   Pulse 85   Temp 98.3 F (36.8 C) (Oral)   Ht 5\' 2"  (1.575 m)   Wt 158 lb 6.4 oz (71.8 kg)   SpO2 95%   BMI 28.97 kg/m  BP Readings from Last 3 Encounters:  12/05/22 120/86  10/18/22 112/72  08/23/22 100/80   Wt Readings from Last 3 Encounters:   12/05/22 158 lb 6.4 oz (71.8 kg)  10/18/22 151 lb 12.8 oz (68.9 kg)  08/23/22 145 lb 3.2 oz (65.9 kg)   SpO2 Readings from Last 3 Encounters:  12/05/22 95%  10/18/22 98%  08/23/22 98%      Physical Exam Vitals and nursing note reviewed.  Constitutional:      Appearance: Normal appearance.  HENT:     Right Ear: Tympanic membrane, ear canal and external ear normal.     Left Ear: Tympanic membrane, ear canal and external ear normal.     Nose:     Right Sinus: No maxillary sinus tenderness or frontal sinus tenderness.     Left Sinus: No maxillary sinus tenderness or frontal sinus tenderness.     Mouth/Throat:     Mouth: Mucous membranes are moist.     Pharynx: Oropharynx is clear. No posterior oropharyngeal erythema.  Cardiovascular:     Rate and Rhythm: Normal rate and regular rhythm.     Heart sounds: Normal heart sounds.  Pulmonary:     Effort: Pulmonary effort is normal.     Breath sounds: Normal breath sounds.  Lymphadenopathy:     Cervical: No cervical adenopathy.  Neurological:     Mental Status: She is alert.      No results found for any visits on 12/05/22.    The ASCVD Risk score (  Arnett DK, et al., 2019) failed to calculate for the following reasons:   The 2019 ASCVD risk score is only valid for ages 47 to 61    Assessment & Plan:   Problem List Items Addressed This Visit       Cardiovascular and Mediastinum   Primary hypertension    Patient currently maintained on omlesartan and hydrochlorothiazide blood pressure well-controlled continue        Respiratory   Bronchitis    Strep, flu, COVID test negative in office.  Given length of symptoms and history of tobacco abuse we will treat with a dose of Avastin 250 mg pack as directed.  Patient given work excuse to be off for 2 days return subsequently referred for 24 hours without fever reducing medications.      Relevant Medications   azithromycin (ZITHROMAX) 250 MG tablet     Other   ADHD  (attention deficit hyperactivity disorder)    Patient, maintained on Adderall 10 mg twice daily.  Blood pressure and pulse within normal limits patient is not having any ADE.  Continue medication as prescribed      Sore throat - Primary    Flu, COVID, strep test in office.  Use over-the-counter analgesics as needed      Relevant Orders   POC COVID-19   Rapid Strep A   POCT Flu A & B Status   Body aches    Flu and COVID test in office.  Patient is over-the-counter analgesics as needed      Relevant Orders   POC COVID-19   POCT Flu A & B Status   Acute cough    Flu and COVID test in office.  Tessalon Perles 200 g 3 times daily as needed      Relevant Medications   benzonatate (TESSALON) 200 MG capsule   Other headache syndrome    Flu and COVID test in office.  Over-the-counter analgesics as needed.  Push fluids       Return in about 3 months (around 03/05/2023) for ADHD med recheck .    Audria Nine, NP

## 2022-12-05 NOTE — Assessment & Plan Note (Signed)
Flu and COVID test in office.  Tessalon Perles 200 g 3 times daily as needed

## 2022-12-05 NOTE — Assessment & Plan Note (Signed)
Patient currently maintained on omlesartan and hydrochlorothiazide blood pressure well-controlled continue

## 2022-12-05 NOTE — Patient Instructions (Signed)
Nice to see you today I want you on the antibiotics for 48 hours before you return to work That is as long as you are free free for 24 hours Follow up with me in 3 month, sooner if you need me

## 2022-12-05 NOTE — Assessment & Plan Note (Signed)
Flu, COVID, strep test in office.  Use over-the-counter analgesics as needed

## 2022-12-06 ENCOUNTER — Encounter: Payer: Self-pay | Admitting: Nurse Practitioner

## 2022-12-06 DIAGNOSIS — R0602 Shortness of breath: Secondary | ICD-10-CM

## 2022-12-06 MED ORDER — PREDNISONE 20 MG PO TABS: ORAL_TABLET | ORAL | 0 refills | Status: AC

## 2022-12-28 ENCOUNTER — Other Ambulatory Visit: Payer: Self-pay | Admitting: Nurse Practitioner

## 2022-12-28 DIAGNOSIS — F902 Attention-deficit hyperactivity disorder, combined type: Secondary | ICD-10-CM

## 2022-12-29 MED ORDER — AMPHETAMINE-DEXTROAMPHETAMINE 20 MG PO TABS: 20.0000 mg | ORAL_TABLET | Freq: Two times a day (BID) | ORAL | 0 refills | Status: DC

## 2023-01-09 ENCOUNTER — Encounter: Payer: Self-pay | Admitting: Nurse Practitioner

## 2023-01-09 DIAGNOSIS — B379 Candidiasis, unspecified: Secondary | ICD-10-CM

## 2023-01-10 MED ORDER — FLUCONAZOLE 150 MG PO TABS: 150.0000 mg | ORAL_TABLET | Freq: Once | ORAL | 0 refills | Status: AC

## 2023-01-24 ENCOUNTER — Other Ambulatory Visit: Payer: Self-pay | Admitting: Nurse Practitioner

## 2023-01-24 DIAGNOSIS — F902 Attention-deficit hyperactivity disorder, combined type: Secondary | ICD-10-CM

## 2023-01-25 MED ORDER — AMPHETAMINE-DEXTROAMPHETAMINE 20 MG PO TABS: 20.0000 mg | ORAL_TABLET | Freq: Two times a day (BID) | ORAL | 0 refills | Status: DC

## 2023-02-06 ENCOUNTER — Encounter: Payer: Self-pay | Admitting: Obstetrics and Gynecology

## 2023-02-15 ENCOUNTER — Encounter: Payer: Self-pay | Admitting: Obstetrics and Gynecology

## 2023-02-15 ENCOUNTER — Other Ambulatory Visit (HOSPITAL_COMMUNITY)
Admission: RE | Admit: 2023-02-15 | Discharge: 2023-02-15 | Disposition: A | Discharge status: Home / Self Care | Payer: Medicaid Other | Source: Ambulatory Visit | Attending: Obstetrics and Gynecology | Admitting: Obstetrics and Gynecology

## 2023-02-15 ENCOUNTER — Ambulatory Visit: Payer: Medicaid Other | Admitting: Obstetrics and Gynecology

## 2023-02-15 VITALS — BP 114/80 | HR 97 | Wt 158.0 lb

## 2023-02-15 DIAGNOSIS — Z30431 Encounter for routine checking of intrauterine contraceptive device: Secondary | ICD-10-CM | POA: Diagnosis not present

## 2023-02-15 DIAGNOSIS — Z975 Presence of (intrauterine) contraceptive device: Secondary | ICD-10-CM | POA: Diagnosis present

## 2023-02-15 DIAGNOSIS — R232 Flushing: Secondary | ICD-10-CM

## 2023-02-15 DIAGNOSIS — N921 Excessive and frequent menstruation with irregular cycle: Secondary | ICD-10-CM

## 2023-02-15 MED ORDER — BORIC ACID CRYS: 600.0000 mg | CRYSTALS | Freq: Every day | Status: AC
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
## 2023-02-19 ENCOUNTER — Encounter: Payer: Self-pay | Admitting: Obstetrics and Gynecology

## 2023-02-19 LAB — CERVICOVAGINAL ANCILLARY ONLY
Bacterial Vaginitis (gardnerella): NEGATIVE
Candida Glabrata: POSITIVE — AB
Candida Vaginitis: NEGATIVE
Chlamydia: NEGATIVE
Comment: NEGATIVE
Comment: NEGATIVE
Comment: NEGATIVE
Comment: NEGATIVE
Comment: NEGATIVE
Comment: NORMAL
Neisseria Gonorrhea: NEGATIVE
Trichomonas: NEGATIVE

## 2023-02-22 ENCOUNTER — Other Ambulatory Visit: Payer: Self-pay | Admitting: Nurse Practitioner

## 2023-02-22 DIAGNOSIS — F902 Attention-deficit hyperactivity disorder, combined type: Secondary | ICD-10-CM

## 2023-02-23 MED ORDER — AMPHETAMINE-DEXTROAMPHETAMINE 20 MG PO TABS: 20.0000 mg | ORAL_TABLET | Freq: Two times a day (BID) | ORAL | 0 refills | Status: DC

## 2023-03-05 ENCOUNTER — Encounter: Payer: Self-pay | Admitting: Nurse Practitioner

## 2023-03-05 ENCOUNTER — Ambulatory Visit: Payer: Medicaid Other | Admitting: Nurse Practitioner

## 2023-03-05 VITALS — BP 100/80 | HR 93 | Temp 98.2°F | Ht 62.0 in | Wt 156.4 lb

## 2023-03-05 DIAGNOSIS — F902 Attention-deficit hyperactivity disorder, combined type: Secondary | ICD-10-CM

## 2023-03-05 NOTE — Assessment & Plan Note (Signed)
 Patient currently maintained on Adderall 20 mg twice daily.  Tolerating medication well.  PDMP reviewed.  No red flags in office we will continue medication as prescribed.  After next office visit stretch patient out to a 81-month follow-up that she has shown 81-month stability on current dose

## 2023-03-05 NOTE — Patient Instructions (Signed)
 Nice to see you today I want to see you in 6 weeks for your physical, sooner if you need me

## 2023-03-05 NOTE — Progress Notes (Signed)
 Established Patient Office Visit  Subjective   Patient ID: Catherine [name], female    DOB: Mar 02, 1985  Age: 38 y.o. MRN: [id number]  Chief Complaint  Patient presents with   Medication Management    ADHD    HPI  ADHD: Patient is currently maintained on Adderall 20 mg twice daily.  Patient was started on Adderall 10 mg twice daily titrated to 15 mg twice daily and then may have been at 20 mg twice daily for approximately 6 months.  She is here for recheck. States that she is doing well on the medication. State that she recently got fired at the beginning. She is looking for a new job.   States that on Wednesday last week she was in a restaurant and had an episode vomiting that continued along with diarrheas for approx 24 hours. Since then she has felt fine. Not more nausea, vomiting, diarrhea or abdominal pain. She is able to eat and drink per her normal.      Review of Systems  Constitutional:  Negative for chills and fever.  Respiratory:  Negative for shortness of breath.   Cardiovascular:  Negative for chest pain and palpitations.  Neurological:  Negative for dizziness and headaches.  Psychiatric/Behavioral:  Negative for hallucinations and suicidal ideas.       Objective:     BP 100/80   Pulse 93   Temp 98.2 F (36.8 C) (Oral)   Ht 5\' 2"  (1.575 m)   Wt 156 lb 6.4 oz (70.9 kg)   LMP 03/05/2023 Comment: currently on 03/05/23  SpO2 95%   BMI 28.61 kg/m  BP Readings from Last 3 Encounters:  03/05/23 100/80  02/15/23 114/80  12/05/22 120/86   Wt Readings from Last 3 Encounters:  03/05/23 156 lb 6.4 oz (70.9 kg)  02/15/23 158 lb (71.7 kg)  12/05/22 158 lb 6.4 oz (71.8 kg)   SpO2 Readings from Last 3 Encounters:  03/05/23 95%  12/05/22 95%  10/18/22 98%      Physical Exam Vitals and nursing note reviewed.  Constitutional:      Appearance: Normal appearance.  Cardiovascular:     Rate and Rhythm: Normal rate and regular rhythm.     Heart sounds: Normal  heart sounds.  Pulmonary:     Effort: Pulmonary effort is normal.     Breath sounds: Normal breath sounds.  Abdominal:     General: Bowel sounds are normal. There is no distension.     Palpations: There is no mass.     Tenderness: There is no abdominal tenderness.     Hernia: No hernia is present.  Neurological:     Mental Status: She is alert.      No results found for any visits on 03/05/23.    The ASCVD Risk score (Arnett DK, et al., 2019) failed to calculate for the following reasons:   The 2019 ASCVD risk score is only valid for ages 76 to 70    Assessment & Plan:   Problem List Items Addressed This Visit       Other   ADHD (attention deficit hyperactivity disorder) - Primary   Patient currently maintained on Adderall 20 mg twice daily.  Tolerating medication well.  PDMP reviewed.  No red flags in office we will continue medication as prescribed.  After next office visit stretch patient out to a 45-month follow-up that she has shown 38-month stability on current dose       Return in about 6 weeks (around  04/16/2023) for CPE and Labs.    Audria Nine, NP

## 2023-03-19 ENCOUNTER — Other Ambulatory Visit: Payer: Self-pay | Admitting: Nurse Practitioner

## 2023-03-19 DIAGNOSIS — F902 Attention-deficit hyperactivity disorder, combined type: Secondary | ICD-10-CM

## 2023-03-20 MED ORDER — AMPHETAMINE-DEXTROAMPHETAMINE 20 MG PO TABS: 20.0000 mg | ORAL_TABLET | Freq: Two times a day (BID) | ORAL | 0 refills | Status: DC

## 2023-04-16 ENCOUNTER — Encounter: Payer: Self-pay | Admitting: Nurse Practitioner

## 2023-04-16 ENCOUNTER — Other Ambulatory Visit: Payer: Self-pay | Admitting: Nurse Practitioner

## 2023-04-16 DIAGNOSIS — F32A Depression, unspecified: Secondary | ICD-10-CM

## 2023-04-16 DIAGNOSIS — I10 Essential (primary) hypertension: Secondary | ICD-10-CM

## 2023-04-16 DIAGNOSIS — F902 Attention-deficit hyperactivity disorder, combined type: Secondary | ICD-10-CM

## 2023-04-16 MED ORDER — AMPHETAMINE-DEXTROAMPHETAMINE 20 MG PO TABS: 20.0000 mg | ORAL_TABLET | Freq: Two times a day (BID) | ORAL | 0 refills | Status: DC

## 2023-04-17 MED ORDER — METOPROLOL SUCCINATE ER 25 MG PO TB24: 25.0000 mg | ORAL_TABLET | Freq: Every day | ORAL | 1 refills | Status: DC

## 2023-04-17 MED ORDER — OLMESARTAN MEDOXOMIL-HCTZ 20-12.5 MG PO TABS: 1.0000 | ORAL_TABLET | Freq: Every day | ORAL | 1 refills | Status: DC

## 2023-04-17 MED ORDER — SERTRALINE HCL 50 MG PO TABS: 50.0000 mg | ORAL_TABLET | Freq: Every day | ORAL | 1 refills | Status: DC

## 2023-04-17 NOTE — Addendum Note (Signed)
 Addended by: Dorothe Gaster on: 04/17/2023 07:13 PM   Modules accepted: Orders

## 2023-05-22 ENCOUNTER — Encounter: Payer: Self-pay | Admitting: Nurse Practitioner

## 2023-05-22 ENCOUNTER — Ambulatory Visit (INDEPENDENT_AMBULATORY_CARE_PROVIDER_SITE_OTHER): Admitting: Nurse Practitioner

## 2023-05-22 VITALS — BP 100/76 | HR 84 | Temp 97.5°F | Ht 62.0 in | Wt 159.0 lb

## 2023-05-22 DIAGNOSIS — J069 Acute upper respiratory infection, unspecified: Secondary | ICD-10-CM | POA: Insufficient documentation

## 2023-05-22 DIAGNOSIS — R12 Heartburn: Secondary | ICD-10-CM

## 2023-05-22 DIAGNOSIS — J029 Acute pharyngitis, unspecified: Secondary | ICD-10-CM | POA: Diagnosis not present

## 2023-05-22 DIAGNOSIS — E7800 Pure hypercholesterolemia, unspecified: Secondary | ICD-10-CM

## 2023-05-22 DIAGNOSIS — F32A Depression, unspecified: Secondary | ICD-10-CM

## 2023-05-22 DIAGNOSIS — Z87891 Personal history of nicotine dependence: Secondary | ICD-10-CM | POA: Diagnosis not present

## 2023-05-22 DIAGNOSIS — Z0000 Encounter for general adult medical examination without abnormal findings: Secondary | ICD-10-CM

## 2023-05-22 DIAGNOSIS — F909 Attention-deficit hyperactivity disorder, unspecified type: Secondary | ICD-10-CM

## 2023-05-22 DIAGNOSIS — I7300 Raynaud's syndrome without gangrene: Secondary | ICD-10-CM | POA: Insufficient documentation

## 2023-05-22 DIAGNOSIS — F419 Anxiety disorder, unspecified: Secondary | ICD-10-CM

## 2023-05-22 DIAGNOSIS — R051 Acute cough: Secondary | ICD-10-CM

## 2023-05-22 DIAGNOSIS — E663 Overweight: Secondary | ICD-10-CM

## 2023-05-22 DIAGNOSIS — F902 Attention-deficit hyperactivity disorder, combined type: Secondary | ICD-10-CM

## 2023-05-22 DIAGNOSIS — I10 Essential (primary) hypertension: Secondary | ICD-10-CM | POA: Diagnosis not present

## 2023-05-22 DIAGNOSIS — Z131 Encounter for screening for diabetes mellitus: Secondary | ICD-10-CM | POA: Diagnosis not present

## 2023-05-22 LAB — COMPREHENSIVE METABOLIC PANEL WITH GFR
ALT: 11 U/L (ref 0–35)
AST: 13 U/L (ref 0–37)
Albumin: 4.3 g/dL (ref 3.5–5.2)
Alkaline Phosphatase: 87 U/L (ref 39–117)
BUN: 10 mg/dL (ref 6–23)
CO2: 26 meq/L (ref 19–32)
Calcium: 9.3 mg/dL (ref 8.4–10.5)
Chloride: 99 meq/L (ref 96–112)
Creatinine, Ser: 0.56 mg/dL (ref 0.40–1.20)
GFR: 114.5 mL/min (ref >60.00)
Glucose, Bld: 87 mg/dL (ref 70–99)
Potassium: 3.8 meq/L (ref 3.5–5.1)
Sodium: 137 meq/L (ref 135–145)
Total Bilirubin: 0.4 mg/dL (ref 0.2–1.2)
Total Protein: 6.8 g/dL (ref 6.0–8.3)

## 2023-05-22 LAB — URINALYSIS, MICROSCOPIC ONLY

## 2023-05-22 LAB — CBC
HCT: 39.6 % (ref 36.0–46.0)
Hemoglobin: 13.4 g/dL (ref 12.0–15.0)
MCHC: 33.9 g/dL (ref 30.0–36.0)
MCV: 96.6 fl (ref 78.0–100.0)
Platelets: 370 10*3/uL (ref 150.0–400.0)
RBC: 4.1 Mil/uL (ref 3.87–5.11)
RDW: 16.9 % — ABNORMAL HIGH (ref 11.5–15.5)
WBC: 6 10*3/uL (ref 4.0–10.5)

## 2023-05-22 LAB — LIPID PANEL
Cholesterol: 195 mg/dL (ref 0–200)
HDL: 45.8 mg/dL (ref >39.00)
LDL Cholesterol: 110 mg/dL — ABNORMAL HIGH (ref 0–99)
NonHDL: 149.16
Total CHOL/HDL Ratio: 4
Triglycerides: 195 mg/dL — ABNORMAL HIGH (ref 0.0–149.0)
VLDL: 39 mg/dL (ref 0.0–40.0)

## 2023-05-22 LAB — TSH: TSH: 2 u[IU]/mL (ref 0.35–5.50)

## 2023-05-22 LAB — POCT COVID BINAXNOW CARD: SARS Coronavirus 2 Ag: NEGATIVE

## 2023-05-22 LAB — HEMOGLOBIN A1C: Hgb A1c MFr Bld: 5.4 % (ref 4.6–6.5)

## 2023-05-22 LAB — POCT RAPID STREP A (OFFICE): Rapid Strep A Screen: NEGATIVE

## 2023-05-22 MED ORDER — AMPHETAMINE-DEXTROAMPHETAMINE 20 MG PO TABS: 20.0000 mg | ORAL_TABLET | Freq: Two times a day (BID) | ORAL | 0 refills | Status: DC

## 2023-05-22 MED ORDER — FLUTICASONE PROPIONATE 50 MCG/ACT NA SUSP: 2.0000 | Freq: Every day | NASAL | 0 refills | Status: AC

## 2023-05-22 MED ORDER — OMEPRAZOLE 20 MG PO CPDR: 20.0000 mg | DELAYED_RELEASE_CAPSULE | Freq: Every day | ORAL | 1 refills | Status: AC

## 2023-05-22 MED ORDER — SERTRALINE HCL 100 MG PO TABS: 100.0000 mg | ORAL_TABLET | Freq: Every day | ORAL | Status: DC

## 2023-05-22 NOTE — Assessment & Plan Note (Signed)
 COVID test in office.  Will do Flonase  nasal spray she can use over-the-counter antitussive  as needed

## 2023-05-22 NOTE — Assessment & Plan Note (Signed)
Strep and COVID test in office.

## 2023-05-22 NOTE — Assessment & Plan Note (Signed)
 Patient currently maintained on metoprolol  25 mg daily and olmesartan -hydrochlorothiazide  20-12.5 mg daily.  Blood pressure well-controlled.  Continue medication as prescribed

## 2023-05-22 NOTE — Assessment & Plan Note (Signed)
 COVID and strep test negative in office.  Patient will use over-the-counter analgesics as needed and drink plenty of fluids.  Flonase  nasal spray to help with sinus pressure and PND

## 2023-05-22 NOTE — Progress Notes (Signed)
 Established Patient Office Visit  Subjective   Patient ID: Catherine [name], female    DOB: 1985-05-19  Age: 38 y.o. MRN: [id number]  Chief Complaint  Patient presents with   Annual Exam   Medication Refill    Prilosec and adderall       for complete physical and follow up of chronic conditions.  HTN: patinet is currently on metoprolol  25mg  and olmesartan -hydrochlorothiazide  20-12.5mg . Does not check at home. Does feel good   ADHD: currently on adderall 20mg  BID.  States that she is doing well with medication.  Anxiety/depression: on zoloft  50mg  daily. States that she feels like she needs an increase. States that she has not been ok mental. States that when she got fired from herjob in [place]. States that she was at [employer]  and talked ot her sisters.   GERD: on omeprazole  20mg  daily. States that she is out of the medication for at least a month. States that at first it was bad but has calim down some. She has changed her diet. States that she does feel like she needs it.   Immunizations: -Tetanus: Completed in 2018 -Influenza: out of season  -Shingles: too young  -Pneumonia: too young    Diet: Fair diet. She has been eating 3 meals and some snacks. She is drinking water and 1 soda a day  Exercise: No regular exercise currenlty. She will go out and walk   Eye exam: PRN Dental exam: PRN    Colonoscopy: too young currenlty average risk  Lung Cancer Screening: NA   Pap smear: followed by Raynell Caller, MD. Last pap 06/09/2021 whth ASC-US   DEXA: too young  Sleep: goes to bed around 12-2am and will get up 9am. Does nap during the day. Does not feel rested. Does snore  Sick sympotms: states that she was at the beach Wednesday to Monday. Her sympotms started on Saturday/Sunday. States that she had nasla congestion that has increased. She has been having some sore throat with PND and coughing. She did try advil  cold and sinus that helped some. 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 Negative for chills and fever.  HENT:  Positive for congestion and sore throat (improved). Negative for ear discharge and ear pain.   Respiratory:  Positive for cough. Negative for sputum production and shortness of breath.   Cardiovascular:  Negative for chest pain and leg swelling.  Gastrointestinal:  Negative for abdominal pain, blood in stool, constipation, diarrhea, nausea and vomiting.       Bm daily   Genitourinary:  Negative for dysuria and hematuria.  Neurological:  Positive for headaches. Negative for tingling.  Psychiatric/Behavioral:  Negative for hallucinations and suicidal ideas.       Objective:     BP 100/76   Pulse 84   Temp (!) 97.5 F (36.4 C) (Oral)   Ht 5\' 2"  (1.575 m)   Wt 159 lb (72.1 kg)   SpO2 97%   BMI 29.08 kg/m  BP Readings from Last 3 Encounters:  05/22/23 100/76  03/05/23 100/80  02/15/23 114/80   Wt Readings from Last 3 Encounters:  05/22/23 159 lb (72.1 kg)  03/05/23 156 lb 6.4 oz (70.9 kg)  02/15/23 158 lb (71.7 kg)   SpO2 Readings from Last 3 Encounters:  05/22/23 97%  03/05/23 95%  12/05/22 95%      Physical Exam Vitals and  nursing note reviewed.  Constitutional:      Appearance: Normal appearance.  HENT:     Right Ear: Tympanic membrane, ear canal and external ear normal.     Left Ear: Tympanic membrane, ear canal and external ear normal.     Nose:     Right Sinus: Maxillary sinus tenderness and frontal sinus tenderness present.     Left Sinus: Maxillary sinus tenderness and frontal sinus tenderness present.     Mouth/Throat:     Mouth: Mucous membranes are moist.     Pharynx: Oropharynx is clear.  Eyes:     Extraocular Movements: Extraocular movements intact.     Pupils: Pupils are equal, round, and reactive to light.  Cardiovascular:     Rate and Rhythm: Normal rate and regular rhythm.     Pulses: Normal pulses.      Heart sounds: Normal heart sounds.  Pulmonary:     Effort: Pulmonary effort is normal.     Breath sounds: Normal breath sounds.  Abdominal:     General: Bowel sounds are normal. There is no distension.     Palpations: There is no mass.     Tenderness: There is no abdominal tenderness.     Hernia: No hernia is present.  Musculoskeletal:     Right lower leg: No edema.     Left lower leg: No edema.  Lymphadenopathy:     Cervical: No cervical adenopathy.  Skin:    General: Skin is warm.  Neurological:     General: No focal deficit present.     Mental Status: She is alert.     Deep Tendon Reflexes:     Reflex Scores:      Bicep reflexes are 2+ on the right side and 2+ on the left side.      Patellar reflexes are 2+ on the right side and 2+ on the left side.    Comments: Bilateral upper and lower extremity strength 5/5  Psychiatric:        Mood and Affect: Mood normal.        Behavior: Behavior normal.        Thought Content: Thought content normal.        Judgment: Judgment normal.      Results for orders placed or performed in visit on 05/22/23  Urine Microscopic  Result Value Ref Range   WBC, UA 0-2/hpf 0-2/hpf   RBC / HPF 0-2/hpf 0-2/hpf   Mucus, UA Presence of (A) None   Squamous Epithelial / HPF Few(5-10/hpf) (A) Rare(0-4/hpf)   Bacteria, UA Rare(<10/hpf) (A) None   Yeast, UA Presence of (A) None      The ASCVD Risk score (Arnett DK, et al., 2019) failed to calculate for the following reasons:   The 2019 ASCVD risk score is only valid for ages 11 to 93    Assessment & Plan:   Problem List Items Addressed This Visit       Cardiovascular and Mediastinum   Primary hypertension   Patient currently maintained on metoprolol  25 mg daily and olmesartan -hydrochlorothiazide  20-12.5 mg daily.  Blood pressure well-controlled.  Continue medication as prescribed      Relevant Orders   CBC   Comprehensive metabolic panel with GFR   TSH   Raynaud's disease without  gangrene   Concern for Raynaud's disease will send patient endocrinologist referral placed today.  Patient has good cap refill and good distal pulses.  Signs and symptoms reviewed when to seek emergent health care  Relevant Orders   Ambulatory referral to Endocrinology     Respiratory   Upper respiratory tract infection   COVID and strep test negative in office.  Patient will use over-the-counter analgesics as needed and drink plenty of fluids.  Flonase  nasal spray to help with sinus pressure and PND      Relevant Medications   fluticasone  (FLONASE ) 50 MCG/ACT nasal spray     Other   ADHD (attention deficit hyperactivity disorder)   Patient currently maintained on Adderall 20 mg twice daily.  Continue medication as prescribed refill sent in today      Relevant Medications   amphetamine -dextroamphetamine  (ADDERALL) 20 MG tablet   Preventative health care - Primary   Discussed age-appropriate immunizations and screening exams.  Did review patient's personal, surgical, social, family histories.  Patient is up-to-date on all age-appropriate vaccinations she would like.  Patient is seen in the for CRC screening.  Patient is too young for breast cancer screening.  Patient is too young for osteoporosis screening.  She is followed by GYN for cervical cancer screening.  Patient was given information at discharge about preventative healthcare maintenance with anticipatory guidance      Anxiety and depression   Patient currently maintained on Zoloft  50 mg daily.  Feels like she needs an increase in her medication.  Denies HI/SI/AVH.  Will increase Zoloft  from 50 mg to 100 mg daily.      Relevant Medications   sertraline  (ZOLOFT ) 100 MG tablet   Sore throat   Strep and COVID test in office.      Relevant Orders   POCT rapid strep A   POCT COVID BINAX NOW CARD   Overweight   Pending TSH, A1c, lipid panel.  Continue work on healthy lifestyle modifications.      Relevant Orders   TSH    Heartburn   Patient tried to wean off of omeprazole  but still having some nausea and reflux symptoms will refill omeprazole  20 mg      Relevant Medications   omeprazole  (PRILOSEC) 20 MG capsule   Acute cough   COVID test in office.  Will do Flonase  nasal spray she can use over-the-counter antitussive  as needed      Relevant Orders   POCT COVID BINAX NOW CARD   Former tobacco use   Pending urine microscopy rule out microscopic hematuria      Relevant Orders   Urine Microscopic (Completed)   Other Visit Diagnoses       Elevated LDL cholesterol level       Relevant Orders   Lipid panel     Screening for diabetes mellitus       Relevant Orders   Hemoglobin A1c       Return in about 6 months (around 11/22/2023) for ADHD, Bp recheck .    Margarie Shay, NP

## 2023-05-22 NOTE — Patient Instructions (Signed)
Nice to see you today I will be in touch with the labs once I have them Follow up with me in 6 months, sooner if you need me 

## 2023-05-22 NOTE — Assessment & Plan Note (Signed)
 Patient currently maintained on Adderall 20 mg twice daily.  Continue medication as prescribed refill sent in today

## 2023-05-22 NOTE — Assessment & Plan Note (Signed)
 Pending TSH, A1c, lipid panel.  Continue work on healthy lifestyle modifications.

## 2023-05-22 NOTE — Assessment & Plan Note (Signed)
 Discussed age-appropriate immunizations and screening exams.  Did review patient's personal, surgical, social, family histories.  Patient is up-to-date on all age-appropriate vaccinations she would like.  Patient is seen in the for CRC screening.  Patient is too young for breast cancer screening.  Patient is too young for osteoporosis screening.  She is followed by GYN for cervical cancer screening.  Patient was given information at discharge about preventative healthcare maintenance with anticipatory guidance

## 2023-05-22 NOTE — Assessment & Plan Note (Signed)
 Patient tried to wean off of omeprazole  but still having some nausea and reflux symptoms will refill omeprazole  20 mg

## 2023-05-22 NOTE — Assessment & Plan Note (Signed)
 Patient currently maintained on Zoloft  50 mg daily.  Feels like she needs an increase in her medication.  Denies HI/SI/AVH.  Will increase Zoloft  from 50 mg to 100 mg daily.

## 2023-05-22 NOTE — Assessment & Plan Note (Signed)
 Concern for Raynaud's disease will send patient endocrinologist referral placed today.  Patient has good cap refill and good distal pulses.  Signs and symptoms reviewed when to seek emergent health care

## 2023-05-22 NOTE — Assessment & Plan Note (Signed)
 Pending urine microscopy rule out microscopic hematuria

## 2023-05-25 ENCOUNTER — Ambulatory Visit: Payer: Self-pay | Admitting: Nurse Practitioner

## 2023-06-15 ENCOUNTER — Encounter: Payer: Self-pay | Admitting: Nurse Practitioner

## 2023-06-15 DIAGNOSIS — F902 Attention-deficit hyperactivity disorder, combined type: Secondary | ICD-10-CM

## 2023-06-15 MED ORDER — AMPHETAMINE-DEXTROAMPHETAMINE 20 MG PO TABS: 20.0000 mg | ORAL_TABLET | Freq: Two times a day (BID) | ORAL | 0 refills | Status: DC

## 2023-06-25 ENCOUNTER — Telehealth: Payer: Self-pay | Admitting: Nurse Practitioner

## 2023-06-25 NOTE — Telephone Encounter (Signed)
 See how the increase of Zoloft  from 50 mg to 100 mg is done for patient's mood

## 2023-06-25 NOTE — Telephone Encounter (Signed)
-----   Message from [facility] sent at 05/22/2023 12:38 PM EDT ----- Regarding: Zoloft  See how the increase of Zoloft  from 50 mg to 100 mg is done for patient's mood

## 2023-06-25 NOTE — Telephone Encounter (Signed)
 Left voicemail for patient to call the office back.

## 2023-06-26 NOTE — Telephone Encounter (Signed)
 Called pt. Pt states that she is doing just fine on 100mg  of Zoloft .  States she has ran out of the 50mg  from taking two at a time but states she needs to see if the 100mg  is filled to pick up.  Advised to pt that if she is not able to pick up script to let us  know.

## 2023-07-03 ENCOUNTER — Telehealth: Payer: Self-pay

## 2023-07-03 DIAGNOSIS — I7300 Raynaud's syndrome without gangrene: Secondary | ICD-10-CM

## 2023-07-03 NOTE — Telephone Encounter (Signed)
 Copied from CRM [id number]. Topic: Referral - Question >> Jul 03, 2023 10:13 AM Catherine [name] wrote: Reason for CRM: Tameka from [facility] clinic called to let us  know that they have to decline this referral because they do not treat or evaluate for raynaud's disease. States this would be rheumatology.

## 2023-07-03 NOTE — Addendum Note (Signed)
 Addended by: WENDEE LYNWOOD HERO on: 07/03/2023 12:50 PM   Modules accepted: Orders

## 2023-07-03 NOTE — Telephone Encounter (Signed)
 New referral placed.

## 2023-07-15 ENCOUNTER — Encounter: Payer: Self-pay | Admitting: Nurse Practitioner

## 2023-07-15 DIAGNOSIS — F902 Attention-deficit hyperactivity disorder, combined type: Secondary | ICD-10-CM

## 2023-07-16 MED ORDER — AMPHETAMINE-DEXTROAMPHETAMINE 20 MG PO TABS
20.0000 mg | ORAL_TABLET | Freq: Two times a day (BID) | ORAL | 0 refills | Status: DC
Start: 2023-07-16 — End: 2023-08-13

## 2023-07-28 ENCOUNTER — Telehealth: Admitting: Family Medicine

## 2023-07-28 DIAGNOSIS — R399 Unspecified symptoms and signs involving the genitourinary system: Secondary | ICD-10-CM

## 2023-07-28 MED ORDER — SULFAMETHOXAZOLE-TRIMETHOPRIM 800-160 MG PO TABS
1.0000 | ORAL_TABLET | Freq: Two times a day (BID) | ORAL | 0 refills | Status: AC
Start: 2023-07-28 — End: 2023-08-02

## 2023-07-28 NOTE — Progress Notes (Signed)
E-Visit for Urinary Problems  We are sorry that you are not feeling well.  Here is how we plan to help!  Based on what you shared with me it looks like you most likely have a simple urinary tract infection.  A UTI (Urinary Tract Infection) is a bacterial infection of the bladder.  Most cases of urinary tract infections are simple to treat but a key part of your care is to encourage you to drink plenty of fluids and watch your symptoms carefully.  I have prescribed Bactrim DS One tablet twice a day for 5 days.  Your symptoms should gradually improve. Call us if the burning in your urine worsens, you develop worsening fever, back pain or pelvic pain or if your symptoms do not resolve after completing the antibiotic.  Urinary tract infections can be prevented by drinking plenty of water to keep your body hydrated.  Also be sure when you wipe, wipe from front to back and don't hold it in!  If possible, empty your bladder every 4 hours.  HOME CARE Drink plenty of fluids Compete the full course of the antibiotics even if the symptoms resolve Remember, when you need to go.go. Holding in your urine can increase the likelihood of getting a UTI! GET HELP RIGHT AWAY IF: You cannot urinate You get a high fever Worsening back pain occurs You see blood in your urine You feel sick to your stomach or throw up You feel like you are going to pass out  MAKE SURE YOU  Understand these instructions. Will watch your condition. Will get help right away if you are not doing well or get worse.   Thank you for choosing an e-visit.  Your e-visit answers were reviewed by a board certified advanced clinical practitioner to complete your personal care plan. Depending upon the condition, your plan could have included both over the counter or prescription medications.  Please review your pharmacy choice. Make sure the pharmacy is open so you can pick up prescription now. If there is a problem, you may contact  your provider through [employer] and have the prescription routed to another pharmacy.  Your safety is important to [place]. If you have drug allergies check your prescription carefully.   For the next 24 hours you can use MyChart to ask questions about today's visit, request a non-urgent call back, or ask for a work or school excuse. You will get an email in the next two days asking about your experience. I hope that your e-visit has been valuable and will speed your recovery. I have spent 5 minutes in review of e-visit questionnaire, review and updating patient chart, medical decision making and response to patient.   Reed Pandy, PA-C

## 2023-08-13 MED ORDER — AMPHETAMINE-DEXTROAMPHETAMINE 20 MG PO TABS: 20.0000 mg | ORAL_TABLET | Freq: Two times a day (BID) | ORAL | 0 refills | Status: DC

## 2023-08-13 NOTE — Telephone Encounter (Signed)
 Last written 07-16-23 with fill date of 07-17-23.  Last OV 05-22-23 No Future OV [facility] [address]

## 2023-08-13 NOTE — Addendum Note (Signed)
 Addended by: KALLIE CLOTILDA SQUIBB on: 08/13/2023 09:25 AM   Modules accepted: Orders

## 2023-09-10 ENCOUNTER — Encounter: Payer: Self-pay | Admitting: Nurse Practitioner

## 2023-09-10 DIAGNOSIS — F902 Attention-deficit hyperactivity disorder, combined type: Secondary | ICD-10-CM

## 2023-09-10 MED ORDER — AMPHETAMINE-DEXTROAMPHETAMINE 20 MG PO TABS: 20.0000 mg | ORAL_TABLET | Freq: Two times a day (BID) | ORAL | 0 refills | Status: DC

## 2023-10-04 MED ORDER — AMPHETAMINE-DEXTROAMPHETAMINE 20 MG PO TABS: 20.0000 mg | ORAL_TABLET | Freq: Two times a day (BID) | ORAL | 0 refills | Status: DC

## 2023-10-04 NOTE — Addendum Note (Signed)
 Addended by: WENDEE LYNWOOD HERO on: 10/04/2023 01:53 PM   Modules accepted: Orders

## 2023-10-20 ENCOUNTER — Encounter: Payer: Self-pay | Admitting: Nurse Practitioner

## 2023-10-20 DIAGNOSIS — I10 Essential (primary) hypertension: Secondary | ICD-10-CM

## 2023-10-20 DIAGNOSIS — F419 Anxiety disorder, unspecified: Secondary | ICD-10-CM

## 2023-10-22 ENCOUNTER — Other Ambulatory Visit: Payer: Self-pay | Admitting: Nurse Practitioner

## 2023-10-22 DIAGNOSIS — F419 Anxiety disorder, unspecified: Secondary | ICD-10-CM

## 2023-10-22 DIAGNOSIS — I10 Essential (primary) hypertension: Secondary | ICD-10-CM

## 2023-10-22 MED ORDER — OLMESARTAN MEDOXOMIL-HCTZ 20-12.5 MG PO TABS: 1.0000 | ORAL_TABLET | Freq: Every day | ORAL | 2 refills | Status: AC

## 2023-10-22 MED ORDER — SERTRALINE HCL 100 MG PO TABS: 100.0000 mg | ORAL_TABLET | Freq: Every day | ORAL | 2 refills | Status: AC

## 2023-10-22 MED ORDER — METOPROLOL SUCCINATE ER 25 MG PO TB24: 25.0000 mg | ORAL_TABLET | Freq: Every day | ORAL | 2 refills | Status: AC

## 2023-11-04 ENCOUNTER — Encounter: Payer: Self-pay | Admitting: Nurse Practitioner

## 2023-11-04 DIAGNOSIS — F902 Attention-deficit hyperactivity disorder, combined type: Secondary | ICD-10-CM

## 2023-11-05 MED ORDER — AMPHETAMINE-DEXTROAMPHETAMINE 20 MG PO TABS: 20.0000 mg | ORAL_TABLET | Freq: Two times a day (BID) | ORAL | 0 refills | Status: DC

## 2023-11-05 NOTE — Telephone Encounter (Signed)
 Last written 10-04-23 with Fill Date Note 10-08-23 Last OV 05-22-23 Next OV 11-26-23 [facility] [address]

## 2023-11-26 ENCOUNTER — Ambulatory Visit: Admitting: Nurse Practitioner

## 2023-11-26 VITALS — BP 128/80 | HR 97 | Temp 98.0°F | Ht 62.0 in | Wt 155.2 lb

## 2023-11-26 DIAGNOSIS — M545 Low back pain, unspecified: Secondary | ICD-10-CM | POA: Diagnosis not present

## 2023-11-26 DIAGNOSIS — R1030 Lower abdominal pain, unspecified: Secondary | ICD-10-CM

## 2023-11-26 DIAGNOSIS — R8290 Unspecified abnormal findings in urine: Secondary | ICD-10-CM

## 2023-11-26 DIAGNOSIS — N898 Other specified noninflammatory disorders of vagina: Secondary | ICD-10-CM

## 2023-11-26 LAB — POCT URINE PREGNANCY: Preg Test, Ur: NEGATIVE

## 2023-11-26 NOTE — Patient Instructions (Signed)
 Nice to see you today I will be in touch with the labs once I have them Follow up if you do not improve

## 2023-11-26 NOTE — Progress Notes (Unsigned)
 Established Patient Office Visit  Subjective   Patient ID: Catherine [name], female    DOB: 1985-02-21  Age: 38 y.o. MRN: [id number]  Chief Complaint  Patient presents with   Urinary Tract Infection    Pt complains that the odor started 2 months. States the bleeding has gradually gotten worse. Complains of seeing blood whenever urinating and before and after sex (light red), complains of abdominal pain. Feels like cramping and pressure. Constant pain in lower back. Pt states that boric acid made odor worse.     Discussed the use of AI scribe software for clinical note transcription with the patient, who gave verbal consent to proceed.  History of Present Illness Catherine [name] is a 38 year old female who presents with persistent vaginal odor and abnormal bleeding.  She has experienced a persistent vaginal odor for the past two months, described as 'smelling like death'. The odor worsened after using boric acid suppositories for a yeast infection, which she discontinued at the beginning of the month. Although the odor has lessened, it remains present.  She has had an IUD in place for nearly three years, with spotting and irregular bleeding since its insertion. Over the past six months, she has experienced continuous bleeding, noticeable after intercourse, though not heavy enough to soak through pads or tampons. She also reports dyspareunia, a new symptom in the last two months.  She had an abnormal Pap smear in 2023 showing ASCUS with negative HPV. No repeat Pap smear has been performed since then.  She experiences lower abdominal pain, particularly below the umbilicus, described as constant pressure, with stabbing pain on the right side and lower back pain. The back pain is constant, worsens with movement, and is more prevalent than the abdominal pain. No fever, chills, or increased urinary frequency, but she notes decreased urination despite increased water intake. Straining during  bowel movements causes more vaginal bleeding.  She has had the same sexual partner and does not express concern for STIs, as she does not use protection and her partner claims fidelity.   {History (Optional):23778}  Review of Systems  Constitutional:  Negative for chills and fever.  Respiratory:  Negative for shortness of breath.   Cardiovascular:  Negative for chest pain.  Gastrointestinal:  Positive for abdominal pain. Negative for blood in stool, constipation, diarrhea, nausea and vomiting.  Genitourinary:  Positive for hematuria. Negative for dysuria and urgency.  Musculoskeletal:  Positive for back pain.      Objective:     BP 128/80   Pulse 97   Temp 98 F (36.7 C) (Oral)   Ht 5' 2 (1.575 m)   Wt 155 lb 3.2 oz (70.4 kg)   SpO2 98%   BMI 28.39 kg/m  {Vitals History (Optional):23777}  Physical Exam Vitals and nursing note reviewed. Exam conducted with a chaperone present Devere Hummer, CMA).  Constitutional:      Appearance: Normal appearance.  Cardiovascular:     Rate and Rhythm: Normal rate and regular rhythm.     Heart sounds: Normal heart sounds.  Pulmonary:     Effort: Pulmonary effort is normal.     Breath sounds: Normal breath sounds.  Abdominal:     General: Bowel sounds are normal. There is no distension.     Palpations: There is no mass.     Tenderness: There is abdominal tenderness. There is left CVA tenderness. There is no right CVA tenderness.     Hernia: No hernia is present.  Genitourinary:    Vagina: Bleeding present.     Cervix: Cervical bleeding present.     Uterus: Tender.      Adnexa: Right adnexa normal and left adnexa normal.  Musculoskeletal:        General: Tenderness present.     Lumbar back: Tenderness and bony tenderness present. Positive right straight leg raise test and positive left straight leg raise test.     Comments: Bilateral lower extremity strength 5/5  Neurological:     Mental Status: She is alert.     Deep Tendon  Reflexes:     Reflex Scores:      Patellar reflexes are 2+ on the right side and 2+ on the left side.     No results found for any visits on 11/26/23.  {Labs (Optional):23779}  The 10-year ASCVD risk score (Arnett DK, et al., 2019) is: 1.1%    Assessment & Plan:   Problem List Items Addressed This Visit   None Visit Diagnoses       Vaginal odor    -  Primary   Relevant Orders   C. trachomatis/N. gonorrhoeae RNA   Trichomonas vaginalis, RNA   WET PREP BY MOLECULAR PROBE     Lower abdominal pain       Relevant Orders   CBC   Comprehensive metabolic panel with GFR   POCT urinalysis dipstick   C. trachomatis/N. gonorrhoeae RNA   Trichomonas vaginalis, RNA   WET PREP BY MOLECULAR PROBE   POCT urine pregnancy     Acute bilateral low back pain without sciatica         Abnormal urine       Relevant Orders   Urine Culture   Urine Microscopic      Assessment and Plan Assessment & Plan Abnormal vaginal bleeding with vaginal malodor, dyspareunia, and lower abdominal/pelvic pain abnormal vaginal bleeding with malodor, dyspareunia, and lower abdominal/pelvic pain for two months. Differential includes pelvic inflammatory disease, cervical infection, or other gynecological issues. - Performed pelvic exam with speculum. - Swabbed for gonorrhea, chlamydia, trichomonas, bacterial vaginosis, and yeast. - Sent urine for culture. - Consider referral to gynecologist if initial workup is inconclusive. - Discuss potential need for ultrasound if symptoms persist. - Consider treatment for PID  Screening for sexually transmitted infections Screening indicated due to symptoms of vaginal malodor and dyspareunia. - Swabbed for gonorrhea, chlamydia, trichomonas, and bacterial vaginosis.  Possible urinary tract infection Blood in urine noted, differential includes urinary tract infection versus bleeding from vaginal source. - Sent urine for culture.  Intrauterine device in situ IUD in  place for nearly three years with ongoing spotting and bleeding. Bleeding pattern has changed over the last six months. - Continue to monitor bleeding pattern and consider further evaluation if symptoms persist.  Return for ADD/ADHD.    Adina Crandall, NP

## 2023-11-27 LAB — URINE CULTURE
MICRO NUMBER:: 17275258
Result:: NO GROWTH
SPECIMEN QUALITY:: ADEQUATE

## 2023-11-27 LAB — COMPREHENSIVE METABOLIC PANEL WITH GFR
ALT: 13 U/L (ref 0–35)
AST: 17 U/L (ref 0–37)
Albumin: 4.2 g/dL (ref 3.5–5.2)
Alkaline Phosphatase: 61 U/L (ref 39–117)
BUN: 9 mg/dL (ref 6–23)
CO2: 27 meq/L (ref 19–32)
Calcium: 8.8 mg/dL (ref 8.4–10.5)
Chloride: 104 meq/L (ref 96–112)
Creatinine, Ser: 0.63 mg/dL (ref 0.40–1.20)
GFR: 110.89 mL/min (ref >60.00)
Glucose, Bld: 85 mg/dL (ref 70–99)
Potassium: 3.3 meq/L — ABNORMAL LOW (ref 3.5–5.1)
Sodium: 138 meq/L (ref 135–145)
Total Bilirubin: 0.3 mg/dL (ref 0.2–1.2)
Total Protein: 6.7 g/dL (ref 6.0–8.3)

## 2023-11-27 LAB — CBC
HCT: 38.5 % (ref 36.0–46.0)
Hemoglobin: 13.3 g/dL (ref 12.0–15.0)
MCHC: 34.5 g/dL (ref 30.0–36.0)
MCV: 95.6 fl (ref 78.0–100.0)
Platelets: 349 K/uL (ref 150.0–400.0)
RBC: 4.02 Mil/uL (ref 3.87–5.11)
RDW: 15.1 % (ref 11.5–15.5)
WBC: 7.1 K/uL (ref 4.0–10.5)

## 2023-11-27 LAB — WET PREP BY MOLECULAR PROBE
Candida species: NOT DETECTED
MICRO NUMBER:: 17275257
SPECIMEN QUALITY:: ADEQUATE
Trichomonas vaginosis: NOT DETECTED

## 2023-11-27 LAB — URINALYSIS, MICROSCOPIC ONLY: RBC / HPF: NONE SEEN (ref 0–?)

## 2023-11-27 LAB — POCT URINALYSIS DIPSTICK
Bilirubin, UA: NEGATIVE
Blood, UA: POSITIVE
Glucose, UA: NEGATIVE
Ketones, UA: NEGATIVE
Leukocytes, UA: NEGATIVE
Nitrite, UA: NEGATIVE
Protein, UA: NEGATIVE
Spec Grav, UA: 1.015 (ref 1.010–1.025)
Urobilinogen, UA: 0.2 U/dL
pH, UA: 6 (ref 5.0–8.0)

## 2023-11-27 LAB — C. TRACHOMATIS/N. GONORRHOEAE RNA
C. trachomatis RNA, TMA: NOT DETECTED
N. gonorrhoeae RNA, TMA: NOT DETECTED

## 2023-11-27 LAB — TRICHOMONAS VAGINALIS, PROBE AMP: Trichomonas vaginalis RNA: NOT DETECTED

## 2023-11-28 ENCOUNTER — Ambulatory Visit: Payer: Self-pay | Admitting: Nurse Practitioner

## 2023-11-28 DIAGNOSIS — B9689 Other specified bacterial agents as the cause of diseases classified elsewhere: Secondary | ICD-10-CM

## 2023-11-28 MED ORDER — METRONIDAZOLE 500 MG PO TABS: 500.0000 mg | ORAL_TABLET | Freq: Two times a day (BID) | ORAL | 0 refills | Status: AC

## 2023-11-30 ENCOUNTER — Encounter: Payer: Self-pay | Admitting: Nurse Practitioner

## 2023-11-30 DIAGNOSIS — F902 Attention-deficit hyperactivity disorder, combined type: Secondary | ICD-10-CM

## 2023-12-03 MED ORDER — AMPHETAMINE-DEXTROAMPHETAMINE 20 MG PO TABS: 20.0000 mg | ORAL_TABLET | Freq: Two times a day (BID) | ORAL | 0 refills | Status: DC

## 2023-12-28 ENCOUNTER — Encounter: Payer: Self-pay | Admitting: Nurse Practitioner

## 2023-12-28 DIAGNOSIS — F902 Attention-deficit hyperactivity disorder, combined type: Secondary | ICD-10-CM

## 2023-12-28 MED ORDER — AMPHETAMINE-DEXTROAMPHETAMINE 20 MG PO TABS: 20.0000 mg | ORAL_TABLET | Freq: Two times a day (BID) | ORAL | 0 refills | Status: DC

## 2024-01-28 ENCOUNTER — Telehealth: Payer: Self-pay | Admitting: Nurse Practitioner

## 2024-01-28 MED ORDER — AMPHETAMINE-DEXTROAMPHETAMINE 20 MG PO TABS: 20.0000 mg | ORAL_TABLET | Freq: Two times a day (BID) | ORAL | 0 refills | Status: AC

## 2024-01-28 NOTE — Addendum Note (Signed)
 Addended by: Zetta Stoneman K on: 01/28/2024 02:18 PM   Modules accepted: Orders

## 2024-01-28 NOTE — Addendum Note (Signed)
 Addended by: SEBASTIAN DANNA GRADE on: 01/28/2024 10:20 AM   Modules accepted: Orders

## 2024-01-28 NOTE — Telephone Encounter (Signed)
 Copied from CRM [id number]. Topic: Clinical - Medication Refill >> Jan 28, 2024  8:53 AM Berneda FALCON wrote: Medication:  amphetamine -dextroamphetamine  (ADDERALL) 20 MG tablet  Has the patient contacted their pharmacy? Yes (Agent: If no, request that the patient contact the pharmacy for the refill. If patient does not wish to contact the pharmacy document the reason why and proceed with request.) (Agent: If yes, when and what did the pharmacy advise?)  This is the patient's preferred pharmacy:   [facility] [address] [facility] [place], [place] - [address] [facility] [address] [place] [address] Phone: [phone] Fax: [phone]  Is this the correct pharmacy for this prescription? Yes If no, delete pharmacy and type the correct one.   Has the prescription been filled recently? No  Is the patient out of the medication? No  Has the patient been seen for an appointment in the last year OR does the patient have an upcoming appointment? Yes  Can we respond through MyChart? Yes  Agent: Please be advised that Rx refills may take up to 3 business days. We ask that you follow-up with your pharmacy.

## 2024-01-28 NOTE — Telephone Encounter (Signed)
 Requesting: Adderall 20 mg bid  Contract: No UDS:  Last Visit: 11/26/2023 Next Visit: 01/28/2024 Last Refill: 12/28/23  Please Advise

## 2024-01-29 NOTE — Telephone Encounter (Signed)
 Prescription sent to pharmacy on 01/28/2024.
